# Patient Record
Sex: Female | Born: 1970 | Race: White | Hispanic: No | Marital: Single | Smoking: Never smoker
Health system: Southern US, Community
[De-identification: ages and names within clinical notes are randomized; demographics above are authoritative.]

## PROBLEM LIST (undated history)

## (undated) DIAGNOSIS — F319 Bipolar disorder, unspecified: Secondary | ICD-10-CM

## (undated) DIAGNOSIS — I639 Cerebral infarction, unspecified: Secondary | ICD-10-CM

## (undated) HISTORY — PX: TONSILLECTOMY: SUR1361

## (undated) HISTORY — PX: TUBAL LIGATION: SHX77

## (undated) HISTORY — PX: REFRACTIVE SURGERY: SHX103

## (undated) HISTORY — PX: BUNIONECTOMY: SHX129

---

## 1999-02-10 ENCOUNTER — Other Ambulatory Visit: Admission: RE | Admit: 1999-02-10 | Discharge: 1999-02-10 | Payer: Self-pay | Admitting: Family Medicine

## 2000-08-23 ENCOUNTER — Other Ambulatory Visit: Admission: RE | Admit: 2000-08-23 | Discharge: 2000-08-23 | Payer: Self-pay | Admitting: Specialist

## 2001-06-07 ENCOUNTER — Other Ambulatory Visit: Admission: RE | Admit: 2001-06-07 | Discharge: 2001-06-07 | Payer: Self-pay | Admitting: Specialist

## 2011-03-21 ENCOUNTER — Ambulatory Visit: Payer: Medicaid Other | Attending: Orthopedic Surgery | Admitting: Occupational Therapy

## 2011-03-21 DIAGNOSIS — R609 Edema, unspecified: Secondary | ICD-10-CM | POA: Insufficient documentation

## 2011-03-21 DIAGNOSIS — IMO0001 Reserved for inherently not codable concepts without codable children: Secondary | ICD-10-CM | POA: Insufficient documentation

## 2011-03-21 DIAGNOSIS — M6281 Muscle weakness (generalized): Secondary | ICD-10-CM | POA: Insufficient documentation

## 2011-03-21 DIAGNOSIS — M25549 Pain in joints of unspecified hand: Secondary | ICD-10-CM | POA: Insufficient documentation

## 2011-03-29 ENCOUNTER — Ambulatory Visit: Payer: Medicaid Other | Admitting: Occupational Therapy

## 2011-04-07 ENCOUNTER — Ambulatory Visit: Payer: Medicaid Other | Admitting: Occupational Therapy

## 2011-04-07 ENCOUNTER — Encounter: Payer: Medicaid Other | Admitting: Occupational Therapy

## 2011-04-13 ENCOUNTER — Encounter: Payer: Medicaid Other | Admitting: Occupational Therapy

## 2011-04-20 ENCOUNTER — Ambulatory Visit: Payer: Medicaid Other | Attending: Orthopedic Surgery | Admitting: Occupational Therapy

## 2011-04-20 DIAGNOSIS — R609 Edema, unspecified: Secondary | ICD-10-CM | POA: Insufficient documentation

## 2011-04-20 DIAGNOSIS — M25549 Pain in joints of unspecified hand: Secondary | ICD-10-CM | POA: Insufficient documentation

## 2011-04-20 DIAGNOSIS — M6281 Muscle weakness (generalized): Secondary | ICD-10-CM | POA: Insufficient documentation

## 2011-04-20 DIAGNOSIS — IMO0001 Reserved for inherently not codable concepts without codable children: Secondary | ICD-10-CM | POA: Insufficient documentation

## 2011-11-18 ENCOUNTER — Emergency Department (INDEPENDENT_AMBULATORY_CARE_PROVIDER_SITE_OTHER)

## 2011-11-18 ENCOUNTER — Encounter (HOSPITAL_BASED_OUTPATIENT_CLINIC_OR_DEPARTMENT_OTHER): Payer: Self-pay | Admitting: Family Medicine

## 2011-11-18 ENCOUNTER — Emergency Department (HOSPITAL_BASED_OUTPATIENT_CLINIC_OR_DEPARTMENT_OTHER)
Admission: EM | Admit: 2011-11-18 | Discharge: 2011-11-18 | Disposition: A | Discharge status: Home / Self Care | Attending: Emergency Medicine | Admitting: Emergency Medicine

## 2011-11-18 DIAGNOSIS — M545 Low back pain, unspecified: Secondary | ICD-10-CM | POA: Insufficient documentation

## 2011-11-18 DIAGNOSIS — S339XXA Sprain of unspecified parts of lumbar spine and pelvis, initial encounter: Secondary | ICD-10-CM | POA: Insufficient documentation

## 2011-11-18 DIAGNOSIS — X500XXA Overexertion from strenuous movement or load, initial encounter: Secondary | ICD-10-CM | POA: Insufficient documentation

## 2011-11-18 DIAGNOSIS — S39012A Strain of muscle, fascia and tendon of lower back, initial encounter: Secondary | ICD-10-CM

## 2011-11-18 MED ORDER — IBUPROFEN 800 MG PO TABS
800.0000 mg | ORAL_TABLET | Freq: Once | ORAL | Status: AC
  Administered 2011-11-18: 800 mg via ORAL
  Filled 2011-11-18: qty 1

## 2011-11-18 MED ORDER — TRAMADOL HCL 50 MG PO TABS
50.0000 mg | ORAL_TABLET | Freq: Once | ORAL | Status: AC
  Administered 2011-11-18: 50 mg via ORAL
  Filled 2011-11-18: qty 1

## 2011-11-18 MED ORDER — TRAMADOL HCL 50 MG PO TABS: 50.0000 mg | ORAL_TABLET | Freq: Four times a day (QID) | ORAL | Status: AC | PRN

## 2011-11-18 MED ORDER — NAPROXEN 500 MG PO TABS: 500.0000 mg | ORAL_TABLET | Freq: Two times a day (BID) | ORAL | Status: AC

## 2011-11-18 NOTE — Discharge Instructions (Signed)

## 2011-11-18 NOTE — ED Notes (Signed)
Pt c/o left low back pain after bending and lifting on Monday. Pt able to ambulate and has been working since injury. Pt taking aleve with some relief.

## 2011-11-18 NOTE — ED Provider Notes (Signed)
History     CSN: [id number]  Arrival date & time 11/18/11  1034   First MD Initiated Contact with Patient 11/18/11 1113      Chief Complaint  Patient presents with  . Back Pain    (Consider location/radiation/quality/duration/timing/severity/associated sxs/prior treatment) Patient is a 41 y.o. female presenting with back pain. The history is provided by the patient. No language interpreter was used.  Back Pain  This is a new problem. Episode onset: 4 days ago. The problem occurs constantly. The problem has been gradually worsening. The pain is associated with lifting heavy objects and twisting. The pain is present in the lumbar spine. The quality of the pain is described as aching and shooting. The pain does not radiate. The pain is moderate. The symptoms are aggravated by bending and certain positions. The pain is the same all the time. Pertinent negatives include no chest pain, no fever, no numbness, no weight loss, no headaches, no abdominal pain, no bowel incontinence, no perianal numbness, no bladder incontinence, no dysuria, no paresthesias, no paresis, no tingling and no weakness. She has tried NSAIDs for the symptoms. The treatment provided mild relief.    Past Medical History  Diagnosis Date  . Migraine     Past Surgical History  Procedure Date  . Tonsillectomy   . Tubal ligation   . Eye surgery     No family history on file.  History  Substance Use Topics  . Smoking status: Never Smoker   . Smokeless tobacco: Not on file  . Alcohol Use: Yes    OB History    Grav Para Term Preterm Abortions TAB SAB Ect Mult Living                  Review of Systems  Constitutional: Negative for fever, weight loss, activity change, appetite change and fatigue.  HENT: Negative for congestion, sore throat, rhinorrhea, neck pain and neck stiffness.   Respiratory: Negative for cough and shortness of breath.   Cardiovascular: Negative for chest pain and palpitations.    Gastrointestinal: Negative for nausea, vomiting, abdominal pain and bowel incontinence.  Genitourinary: Negative for bladder incontinence, dysuria, urgency, frequency and flank pain.  Musculoskeletal: Positive for back pain. Negative for myalgias and arthralgias.  Neurological: Negative for dizziness, tingling, weakness, light-headedness, numbness, headaches and paresthesias.  All other systems reviewed and are negative.    Allergies  Aspirin  Home Medications   Current Outpatient Rx  Name Route Sig Dispense Refill  . ESCITALOPRAM OXALATE 10 MG PO TABS Oral Take 10 mg by mouth daily.    Marland Kitchen LAMICTAL PO Oral Take by mouth.    Marland Kitchen NAPROXEN 500 MG PO TABS Oral Take 1 tablet (500 mg total) by mouth 2 (two) times daily. 30 tablet 0  . TRAMADOL HCL 50 MG PO TABS Oral Take 1 tablet (50 mg total) by mouth every 6 (six) hours as needed for pain. 15 tablet 0    BP 140/78  Pulse 73  Temp(Src) 98.4 F (36.9 C) (Oral)  Resp 20  SpO2 100%  LMP 11/06/2011  Physical Exam  Nursing note and vitals reviewed. Constitutional: She is oriented to person, place, and time. She appears well-developed and well-nourished.       Uncomfortable in appearance  HENT:  Head: Normocephalic and atraumatic.  Mouth/Throat: Oropharynx is clear and moist.  Eyes: Conjunctivae and EOM are normal. Pupils are equal, round, and reactive to light.  Neck: Normal range of motion. Neck supple.  Cardiovascular: Normal  rate, regular rhythm, normal heart sounds and intact distal pulses.  Exam reveals no gallop and no friction rub.   No murmur heard. Pulmonary/Chest: Effort normal and breath sounds normal. No respiratory distress. She exhibits no tenderness.  Abdominal: Soft. Bowel sounds are normal. There is no tenderness. There is no rebound and no guarding.  Musculoskeletal:       Lumbar back: She exhibits decreased range of motion, tenderness, pain and spasm. She exhibits no bony tenderness.       Back:  Neurological:  She is alert and oriented to person, place, and time. She has normal strength and normal reflexes. No cranial nerve deficit or sensory deficit.  Skin: Skin is warm and dry. No rash noted.    ED Course  Procedures (including critical care time)  Labs Reviewed - No data to display Dg Lumbar Spine Complete  11/18/2011  *RADIOLOGY REPORT*  Clinical Data: 41 year old female with low back pain after bending and lifting.  LUMBAR SPINE - COMPLETE 4+ VIEW  Comparison: None.  Findings: Bone mineralization is within normal limits.  Normal lumbar segmentation.  Normal vertebral height and alignment. Relatively preserved disc spaces.  No pars fracture.  Negative SI joints and sacral ala.  IMPRESSION: Negative radiographic appearance of the lumbar spine.  Original Report Authenticated By: Ulla Potash III, M.D.     1. Lumbosacral strain       MDM  Lumbosacral strain with negative films. No concern about a leading cause of back pain such as cauda equina. She has no neurologic symptoms. No urinary symptoms. She is encouraged to apply ice for 2 days and heat thereafter. Provided strict return precautions. Provided a prescription for Ultram and Naprosyn. She refused additional pain medication and muscle relaxants.        Dayton Bailiff, MD 11/18/11 1255

## 2011-11-18 NOTE — ED Notes (Signed)
Patient transported to X-ray 

## 2012-10-22 ENCOUNTER — Other Ambulatory Visit: Payer: Self-pay | Admitting: Family Medicine

## 2012-10-22 ENCOUNTER — Other Ambulatory Visit (HOSPITAL_COMMUNITY)
Admission: RE | Admit: 2012-10-22 | Discharge: 2012-10-22 | Disposition: A | Discharge status: Home / Self Care | Payer: BC Managed Care – PPO | Source: Ambulatory Visit | Attending: Family Medicine | Admitting: Family Medicine

## 2012-10-22 DIAGNOSIS — Z124 Encounter for screening for malignant neoplasm of cervix: Secondary | ICD-10-CM | POA: Insufficient documentation

## 2014-01-16 ENCOUNTER — Encounter (HOSPITAL_COMMUNITY): Payer: Self-pay | Admitting: Emergency Medicine

## 2014-01-16 ENCOUNTER — Emergency Department (HOSPITAL_COMMUNITY): Payer: Federal, State, Local not specified - PPO

## 2014-01-16 ENCOUNTER — Observation Stay (HOSPITAL_COMMUNITY)
Admission: EM | Admit: 2014-01-16 | Discharge: 2014-01-17 | Disposition: A | Discharge status: Home / Self Care | Payer: Federal, State, Local not specified - PPO | Attending: Cardiology | Admitting: Cardiology

## 2014-01-16 DIAGNOSIS — I10 Essential (primary) hypertension: Secondary | ICD-10-CM | POA: Diagnosis present

## 2014-01-16 DIAGNOSIS — E785 Hyperlipidemia, unspecified: Secondary | ICD-10-CM

## 2014-01-16 DIAGNOSIS — Z886 Allergy status to analgesic agent status: Secondary | ICD-10-CM | POA: Insufficient documentation

## 2014-01-16 DIAGNOSIS — Z79899 Other long term (current) drug therapy: Secondary | ICD-10-CM | POA: Insufficient documentation

## 2014-01-16 DIAGNOSIS — G43909 Migraine, unspecified, not intractable, without status migrainosus: Secondary | ICD-10-CM | POA: Insufficient documentation

## 2014-01-16 DIAGNOSIS — R079 Chest pain, unspecified: Principal | ICD-10-CM | POA: Diagnosis present

## 2014-01-16 HISTORY — DX: Cerebral infarction, unspecified: I63.9

## 2014-01-16 HISTORY — DX: Bipolar disorder, unspecified: F31.9

## 2014-01-16 LAB — CBC
HEMATOCRIT: 39.3 % (ref 36.0–46.0)
HEMOGLOBIN: 14.4 g/dL (ref 12.0–15.0)
MCH: 31.8 pg (ref 26.0–34.0)
MCHC: 36.6 g/dL — AB (ref 30.0–36.0)
MCV: 86.8 fL (ref 78.0–100.0)
Platelets: 253 10*3/uL (ref 150–400)
RBC: 4.53 MIL/uL (ref 3.87–5.11)
RDW: 12.7 % (ref 11.5–15.5)
WBC: 8.6 10*3/uL (ref 4.0–10.5)

## 2014-01-16 LAB — BASIC METABOLIC PANEL
BUN: 13 mg/dL (ref 6–23)
CALCIUM: 10 mg/dL (ref 8.4–10.5)
CO2: 23 mEq/L (ref 19–32)
CREATININE: 0.94 mg/dL (ref 0.50–1.10)
Chloride: 103 mEq/L (ref 96–112)
GFR, EST AFRICAN AMERICAN: 84 mL/min — AB (ref >90)
GFR, EST NON AFRICAN AMERICAN: 72 mL/min — AB (ref >90)
GLUCOSE: 103 mg/dL — AB (ref 70–99)
Potassium: 3.8 mEq/L (ref 3.7–5.3)
Sodium: 139 mEq/L (ref 137–147)

## 2014-01-16 LAB — HEMOGLOBIN A1C
Hgb A1c MFr Bld: 5.3 % (ref <5.7)
Mean Plasma Glucose: 105 mg/dL (ref <117)

## 2014-01-16 LAB — I-STAT TROPONIN, ED: Troponin i, poc: 0 ng/mL (ref 0.00–0.08)

## 2014-01-16 LAB — CBG MONITORING, ED: Glucose-Capillary: 105 mg/dL — ABNORMAL HIGH (ref 70–99)

## 2014-01-16 LAB — TSH: TSH: 3.59 u[IU]/mL (ref 0.350–4.500)

## 2014-01-16 LAB — TROPONIN I

## 2014-01-16 MED ORDER — NITROGLYCERIN 0.4 MG SL SUBL: 0.4000 mg | SUBLINGUAL_TABLET | SUBLINGUAL | Status: DC | PRN

## 2014-01-16 MED ORDER — NITROGLYCERIN IN D5W 200-5 MCG/ML-% IV SOLN: 5.0000 ug/min | INTRAVENOUS | Status: DC

## 2014-01-16 MED ORDER — MORPHINE SULFATE 4 MG/ML IJ SOLN
4.0000 mg | Freq: Once | INTRAMUSCULAR | Status: AC
  Administered 2014-01-16: 4 mg via INTRAVENOUS
  Filled 2014-01-16: qty 1

## 2014-01-16 MED ORDER — METOPROLOL TARTRATE 12.5 MG HALF TABLET
12.5000 mg | ORAL_TABLET | Freq: Two times a day (BID) | ORAL | Status: DC
  Administered 2014-01-16 – 2014-01-17 (×2): 12.5 mg via ORAL
  Filled 2014-01-16 (×3): qty 1

## 2014-01-16 MED ORDER — ATORVASTATIN CALCIUM 40 MG PO TABS
40.0000 mg | ORAL_TABLET | Freq: Every day | ORAL | Status: DC
  Filled 2014-01-16 (×2): qty 1

## 2014-01-16 MED ORDER — NITROGLYCERIN IN D5W 200-5 MCG/ML-% IV SOLN
5.0000 ug/min | INTRAVENOUS | Status: DC
  Administered 2014-01-16: 5 ug/min via INTRAVENOUS
  Filled 2014-01-16: qty 250

## 2014-01-16 MED ORDER — LAMOTRIGINE 200 MG PO TABS
200.0000 mg | ORAL_TABLET | Freq: Every day | ORAL | Status: DC
  Administered 2014-01-16 – 2014-01-17 (×2): 200 mg via ORAL
  Filled 2014-01-16 (×3): qty 1

## 2014-01-16 MED ORDER — ASPIRIN EC 81 MG PO TBEC
81.0000 mg | DELAYED_RELEASE_TABLET | Freq: Every day | ORAL | Status: DC
  Administered 2014-01-17: 81 mg via ORAL
  Filled 2014-01-16: qty 1

## 2014-01-16 MED ORDER — ACETAMINOPHEN 325 MG PO TABS
650.0000 mg | ORAL_TABLET | Freq: Four times a day (QID) | ORAL | Status: DC | PRN
  Administered 2014-01-16 – 2014-01-17 (×2): 650 mg via ORAL
  Filled 2014-01-16 (×2): qty 2

## 2014-01-16 MED ORDER — PERPHENAZINE 2 MG PO TABS
2.0000 mg | ORAL_TABLET | Freq: Every day | ORAL | Status: DC
  Administered 2014-01-16: 2 mg via ORAL
  Filled 2014-01-16 (×2): qty 1

## 2014-01-16 MED ORDER — ESCITALOPRAM OXALATE 10 MG PO TABS
10.0000 mg | ORAL_TABLET | Freq: Every day | ORAL | Status: DC
  Administered 2014-01-16 – 2014-01-17 (×2): 10 mg via ORAL
  Filled 2014-01-16 (×2): qty 1

## 2014-01-16 MED ORDER — ESCITALOPRAM OXALATE 10 MG PO TABS: 10.0000 mg | ORAL_TABLET | Freq: Every day | ORAL | Status: DC

## 2014-01-16 NOTE — ED Notes (Signed)
Dr. Brackbill at bedside 

## 2014-01-16 NOTE — H&P (Signed)
Candice Reynolds is an 43 y.o. female.   Chief Complaint: Chest Pain HPI:  The patient is a 43 yo female with a history of migraines.  She works at the [employer] as a Development worker, community carrier.  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  At its worst the intensity was 8/10.  There was radiation to her left shoulder blade.  She reports feeling SOB, more tired, and warm.  She tried taking Tums with no relief.  She went to [facility] and they gave her four baby ASA and on route to [facility], EMS gave a SL NTG which apparently eased the pain some.  It seems as though the pain is worse when she exhales.   She also gets some mild lower extremity edema.  The patient currently denies nausea, vomiting, fever, orthopnea, dizziness, PND, cough, congestion, abdominal pain, hematochezia, melena.  Her father has had seven stents and the first was at ~ age 87.  She has never smoked and drinks one beer after work.    Past Medical History  Diagnosis Date  . Migraine     Past Surgical History  Procedure Laterality Date  . Tonsillectomy    . Tubal ligation    . Eye surgery      No family history on file. Social History:  reports that she has never smoked. She does not have any smokeless tobacco history on file. She reports that she drinks alcohol. She reports that she does not use illicit drugs.  Allergies:  Allergies  Allergen Reactions  . Aspirin      (Not in a hospital admission)  Results for orders placed during the hospital encounter of 01/16/14 (from the past 48 hour(s))  CBG MONITORING, ED     Status: Abnormal   Collection Time    01/16/14 12:25 PM      Result Value Ref Range   Glucose-Capillary 105 (*) 70 - 99 mg/dL  CBC     Status: Abnormal   Collection Time    01/16/14 12:26 PM      Result Value Ref Range   WBC 8.6  4.0 - 10.5 K/uL   RBC 4.53  3.87 - 5.11 MIL/uL   Hemoglobin 14.4  12.0 - 15.0 g/dL   HCT  39.3  36.0 - 46.0 %   MCV 86.8  78.0 - 100.0 fL   MCH 31.8  26.0 - 34.0 pg   MCHC 36.6 (*) 30.0 - 36.0 g/dL   RDW 12.7  11.5 - 15.5 %   Platelets 253  150 - 400 K/uL  BASIC METABOLIC PANEL     Status: Abnormal   Collection Time    01/16/14 12:26 PM      Result Value Ref Range   Sodium 139  137 - 147 mEq/L   Potassium 3.8  3.7 - 5.3 mEq/L   Chloride 103  96 - 112 mEq/L   CO2 23  19 - 32 mEq/L   Glucose, Bld 103 (*) 70 - 99 mg/dL   BUN 13  6 - 23 mg/dL   Creatinine, Ser 0.94  0.50 - 1.10 mg/dL   Calcium 10.0  8.4 - 10.5 mg/dL   GFR calc non Af Amer 72 (*) >90 mL/min   GFR calc Af Amer 84 (*) >90 mL/min   Comment: (NOTE)     The eGFR has been calculated using the CKD EPI equation.  This calculation has not been validated in all clinical situations.     eGFR's persistently <90 mL/min signify possible Chronic Kidney     Disease.  Randolm Idol, ED     Status: None   Collection Time    01/16/14 12:46 PM      Result Value Ref Range   Troponin i, poc 0.00  0.00 - 0.08 ng/mL   Comment 3            Comment: Due to the release kinetics of cTnI,     a negative result within the first hours     of the onset of symptoms does not rule out     myocardial infarction with certainty.     If myocardial infarction is still suspected,     repeat the test at appropriate intervals.   Dg Chest 2 View  01/16/2014   CLINICAL DATA:  Chest pain.  Hypertension.  EXAM: CHEST  2 VIEW  COMPARISON:  None.  FINDINGS: The heart size and mediastinal contours are within normal limits. Both lungs are clear. The visualized skeletal structures are unremarkable.  IMPRESSION: No active cardiopulmonary disease.   Electronically Signed   By: Dereck Ligas M.D.   On: 01/16/2014 13:15    Review of Systems  All other systems reviewed and are negative.   Blood pressure 160/88, pulse 95, temperature 98.9 F (37.2 C), temperature source Oral, resp. rate 19, SpO2 98.00%. Physical Exam  Nursing note and vitals  reviewed. Constitutional: She is oriented to person, place, and time. She appears well-developed and well-nourished.  Appears uncomfortable  HENT:  Head: Normocephalic and atraumatic.  Mouth/Throat: Oropharynx is clear and moist. No oropharyngeal exudate.  Eyes: EOM are normal. Pupils are equal, round, and reactive to light. No scleral icterus.  Neck: Normal range of motion. Neck supple. No JVD present.  Cardiovascular: Normal rate, regular rhythm, S1 normal and S2 normal.   No murmur heard. Pulses:      Radial pulses are 2+ on the right side, and 2+ on the left side.       Dorsalis pedis pulses are 2+ on the right side, and 2+ on the left side.  No Carotid Bruit  Respiratory: Effort normal and breath sounds normal. No respiratory distress. She has no wheezes. She has no rales.  GI: Soft. Bowel sounds are normal. She exhibits no distension. There is no tenderness.  Musculoskeletal: She exhibits no edema.  Lymphadenopathy:    She has no cervical adenopathy.  Neurological: She is alert and oriented to person, place, and time. She exhibits normal muscle tone.  Skin: Skin is warm and dry.  Psychiatric: She has a normal mood and affect.     Assessment/Plan Active Problems:   Chest pain at rest She will be admitted for observation.  Cp has typical and atypical features(more pronounced with exhaling).  The EKG from [facility] had TWI in V1-2 and here just V1.  Cycle troponin, check lipids, TSH, A1C.  Add lopressor 12.66m BID, lipitor and IV NTG.  If she rules out, we will do a treadmill myovue tomorrow.    Hypertension  Monitor on NTG and lopressor.    BTarri Fuller6/11/2013, 3:13 PM  Seen with Mr. [place]ara [facility] [place]  Her history is concerning for ischemic heart pain. She began to have mild twinges of chest pain several weeks ago. Her PCP advised trial of TUMS which she did but with no effect. Today's episode was associated with brief radiation to the back at  times and she also noted some cold  tingling of the fingers of her left hand. Her EKG shows no acute changes. Initial troponin is normal. Her chest pain has now been resolved after IV morphine. Plan as above. Treadmill myoview in am if she rules out. Will recheck EKG in am.

## 2014-01-16 NOTE — ED Provider Notes (Signed)
Medical screening examination/treatment/procedure(s) were conducted as a shared visit with non-physician practitioner(s) and myself.  I personally evaluated the patient during the encounter.   EKG Interpretation   Date/Time:  Thursday January 16 2014 12:19:25 EDT Ventricular Rate:  72 PR Interval:  157 QRS Duration: 75 QT Interval:  382 QTC Calculation: 418 R Axis:   38 Text Interpretation:  Sinus rhythm Probable left atrial enlargement No old  tracing to compare Confirmed by [facility]  MD, TREY ([id number]) on 01/16/2014  2:57:08 PM        Candyce Churn III, MD 01/16/14 2037

## 2014-01-16 NOTE — ED Notes (Signed)
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  Seen at [facility] physicians, given 324 ASA and 1 SL NTG pta, states pain is not as strong.

## 2014-01-16 NOTE — ED Notes (Signed)
Cards at bedside

## 2014-01-16 NOTE — ED Notes (Signed)
Rob Browning, PA at bedside  

## 2014-01-16 NOTE — ED Notes (Signed)
Phlebotomy at bedside.

## 2014-01-16 NOTE — ED Notes (Signed)
Patient transferred to [address] [place] by this nurse

## 2014-01-16 NOTE — ED Provider Notes (Signed)
Medical screening examination/treatment/procedure(s) were conducted as a shared visit with non-physician practitioner(s) and myself.  I personally evaluated the patient during the encounter.   EKG Interpretation   Date/Time:  Thursday January 16 2014 12:19:25 EDT Ventricular Rate:  72 PR Interval:  157 QRS Duration: 75 QT Interval:  382 QTC Calculation: 418 R Axis:   38 Text Interpretation:  Sinus rhythm Probable left atrial enlargement No old  tracing to compare Confirmed by [facility]  MD, TREY ([id number]) on 01/16/2014  2:57:40 PM      43 year old female presenting with chest pain. On exam, nontoxic, not distressed, no chest wall tenderness to palpation, heart sounds normal with regular rate and rhythm, lungs clear to auscultation bilaterally.  She has multiple cardiac risk factors. Plan to discuss with cardiology.  Admit.  Clinical Impression: 1. Chest pain       Candyce Churn III, MD 01/16/14 2014

## 2014-01-16 NOTE — ED Provider Notes (Signed)
CSN: [id number]     Arrival date & time 01/16/14  1210 History   First MD Initiated Contact with Patient 01/16/14 1212     Chief Complaint  Patient presents with  . Chest Pain     (Consider location/radiation/quality/duration/timing/severity/associated sxs/prior Treatment) HPI Comments: Patient presents to the emergency department with chief complaint of chest pain. She states the chest pain started this morning around 10:00. She describes pain as sharp and left-sided. She rates it as a 6/10. 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 She denies any associated shortness of breath or diaphoresis. She states that she was seen [facility] physicians, and was given aspirin, and nitroglycerin. She reports mild improvement with nitroglycerin. She has extensive family history of heart disease, and was referred to the emergency department by [facility].  The history is provided by the patient. No language interpreter was used.    Past Medical History  Diagnosis Date  . Migraine    Past Surgical History  Procedure Laterality Date  . Tonsillectomy    . Tubal ligation    . Eye surgery     No family history on file. History  Substance Use Topics  . Smoking status: Never Smoker   . Smokeless tobacco: Not on file  . Alcohol Use: Yes   OB History   Grav Para Term Preterm Abortions TAB SAB Ect Mult Living                 Review of Systems  Constitutional: Negative for fever and chills.  Respiratory: Negative for shortness of breath.   Cardiovascular: Positive for chest pain.  Gastrointestinal: Negative for nausea, vomiting, diarrhea and constipation.  Genitourinary: Negative for dysuria.      Allergies  Aspirin  Home Medications   Prior to Admission medications   Medication Sig Start Date End Date Taking? Authorizing Provider  escitalopram (LEXAPRO) 10 MG tablet Take 10 mg by mouth daily.    Historical Provider, MD  LamoTRIgine (LAMICTAL PO) Take by mouth.    Historical  Provider, MD   BP 151/87  Pulse 86  Temp(Src) 98.9 F (37.2 C) (Oral)  Resp 13  SpO2 98% Physical Exam  Nursing note and vitals reviewed. Constitutional: She is oriented to person, place, and time. She appears well-developed and well-nourished.  HENT:  Head: Normocephalic and atraumatic.  Eyes: Conjunctivae and EOM are normal. Pupils are equal, round, and reactive to light.  Neck: Normal range of motion. Neck supple.  Cardiovascular: Normal rate and regular rhythm.  Exam reveals no gallop and no friction rub.   No murmur heard. Pulmonary/Chest: Effort normal and breath sounds normal. No respiratory distress. She has no wheezes. She has no rales. She exhibits no tenderness.  Clear to auscultation  Abdominal: Soft. She exhibits no distension and no mass. There is no tenderness. There is no rebound and no guarding.  Musculoskeletal: Normal range of motion. She exhibits no edema and no tenderness.  Neurological: She is alert and oriented to person, place, and time.  Skin: Skin is warm and dry.  Psychiatric: She has a normal mood and affect. Her behavior is normal. Judgment and thought content normal.    ED Course  Procedures (including critical care time) Labs Review Labs Reviewed  CBC - Abnormal; Notable for the following:    MCHC 36.6 (*)    All other components within normal limits  BASIC METABOLIC PANEL - Abnormal; Notable for the following:    Glucose, Bld 103 (*)  GFR calc non Af Amer 72 (*)    GFR calc Af Amer 84 (*)    All other components within normal limits  CBG MONITORING, ED - Abnormal; Notable for the following:    Glucose-Capillary 105 (*)    All other components within normal limits  I-STAT TROPOININ, ED    Imaging Review Dg Chest 2 View  01/16/2014   CLINICAL DATA:  Chest pain.  Hypertension.  EXAM: CHEST  2 VIEW  COMPARISON:  None.  FINDINGS: The heart size and mediastinal contours are within normal limits. Both lungs are clear. The visualized skeletal  structures are unremarkable.  IMPRESSION: No active cardiopulmonary disease.   Electronically Signed   By: Andreas Newport M.D.   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 She has extensive family history of heart disease. She was seen by her PCP, who recommended that she come to the emergency department. PCP notes new T-wave inversion in V2.  HEART score of 4.  Patient discussed with Dr. Loretha Stapler, who agrees with cardiology consultation.  3:39 PM Patient seen by cardiology.  Admit for overnight observation.  Roxy Horseman, PA-C 01/16/14 1539

## 2014-01-17 ENCOUNTER — Observation Stay (HOSPITAL_COMMUNITY): Payer: Federal, State, Local not specified - PPO

## 2014-01-17 DIAGNOSIS — R079 Chest pain, unspecified: Secondary | ICD-10-CM

## 2014-01-17 DIAGNOSIS — E785 Hyperlipidemia, unspecified: Secondary | ICD-10-CM

## 2014-01-17 DIAGNOSIS — I10 Essential (primary) hypertension: Secondary | ICD-10-CM

## 2014-01-17 LAB — TROPONIN I

## 2014-01-17 LAB — LIPID PANEL
CHOL/HDL RATIO: 3.8 ratio
Cholesterol: 188 mg/dL (ref 0–200)
HDL: 50 mg/dL (ref >39)
LDL CALC: 112 mg/dL — AB (ref 0–99)
Triglycerides: 132 mg/dL (ref <150)
VLDL: 26 mg/dL (ref 0–40)

## 2014-01-17 MED ORDER — HYDROCODONE-ACETAMINOPHEN 5-325 MG PO TABS
1.0000 | ORAL_TABLET | ORAL | Status: DC | PRN
  Administered 2014-01-17: 1 via ORAL
  Filled 2014-01-17: qty 1

## 2014-01-17 MED ORDER — TECHNETIUM TC 99M SESTAMIBI GENERIC - CARDIOLITE
10.0000 | Freq: Once | INTRAVENOUS | Status: AC | PRN
  Administered 2014-01-17: 10 via INTRAVENOUS

## 2014-01-17 MED ORDER — REGADENOSON 0.4 MG/5ML IV SOLN
INTRAVENOUS | Status: AC
  Administered 2014-01-17: 0.4 mg via INTRAVENOUS
  Filled 2014-01-17: qty 5

## 2014-01-17 MED ORDER — REGADENOSON 0.4 MG/5ML IV SOLN
0.4000 mg | Freq: Once | INTRAVENOUS | Status: AC
  Administered 2014-01-17: 0.4 mg via INTRAVENOUS

## 2014-01-17 MED ORDER — TECHNETIUM TC 99M SESTAMIBI GENERIC - CARDIOLITE
30.0000 | Freq: Once | INTRAVENOUS | Status: AC | PRN
  Administered 2014-01-17: 30 via INTRAVENOUS

## 2014-01-17 MED ORDER — ATORVASTATIN CALCIUM 40 MG PO TABS: 40.0000 mg | ORAL_TABLET | Freq: Every day | ORAL | Status: DC

## 2014-01-17 MED ORDER — ONDANSETRON HCL 4 MG/2ML IJ SOLN
4.0000 mg | Freq: Three times a day (TID) | INTRAMUSCULAR | Status: DC | PRN
  Administered 2014-01-17: 4 mg via INTRAVENOUS
  Filled 2014-01-17: qty 2

## 2014-01-17 NOTE — Progress Notes (Signed)
    Subjective:  No further CP. Had some nausea and HA prior. Turing off NTG  Objective:  Vital Signs in the last 24 hours: Temp:  [98 F (36.7 C)-98.9 F (37.2 C)] 98 F (36.7 C) (06/05 0805) Pulse Rate:  [74-95] 76 (06/05 0805) Resp:  [10-24] 18 (06/05 0805) BP: (103-165)/(61-96) 103/61 mmHg (06/05 0805) SpO2:  [95 %-100 %] 95 % (06/05 0805) Weight:  [180 lb 1.6 oz (81.693 kg)] 180 lb 1.6 oz (81.693 kg) (06/05 0353)  Intake/Output from previous day: 06/04 0701 - 06/05 0700 In: 355.9 [P.O.:240; I.V.:115.9] Out: 350 [Urine:350]   Physical Exam: General: Well developed, well nourished, in no acute distress. Head:  Normocephalic and atraumatic. Lungs: Clear to auscultation and percussion. Heart: Normal S1 and S2.  No murmur, rubs or gallops.  Abdomen: soft, non-tender, positive bowel sounds. Extremities: No clubbing or cyanosis. No edema. Neurologic: Alert and oriented x 3.    Lab Results:  Recent Labs  01/16/14 1226  WBC 8.6  HGB 14.4  PLT 253    Recent Labs  01/16/14 1226  NA 139  K 3.8  CL 103  CO2 23  GLUCOSE 103*  BUN 13  CREATININE 0.94    Recent Labs  01/16/14 2227 01/17/14 0508  TROPONINI <0.30 <0.30     Imaging: Dg Chest 2 View  01/16/2014   CLINICAL DATA:  Chest pain.  Hypertension.  EXAM: CHEST  2 VIEW  COMPARISON:  None.  FINDINGS: The heart size and mediastinal contours are within normal limits. Both lungs are clear. The visualized skeletal structures are unremarkable.  IMPRESSION: No active cardiopulmonary disease.   Electronically Signed   By: Andreas Newport M.D.   On: 01/16/2014 13:15   Personally viewed.   Telemetry: No adverse rhythms Personally viewed.   EKG:  NSR, no ST changes  Cardiac Studies:  NUC pending  Assessment/Plan:  Principal Problem:   Chest pain at rest Active Problems:   Hypertension   -NUC stress -stop NTG -Trop normal -ECG reassuring -DC home if low risk NUC.   Donato Schultz 01/17/2014, 9:04  AM

## 2014-01-17 NOTE — Discharge Summary (Signed)
Personally seen and examined. Agree with above. Reassuring NUC stress, low risk.  Mark Skains, MD  

## 2014-01-17 NOTE — Discharge Summary (Signed)
Physician Discharge Summary  Patient ID: Candice Reynolds MRN: [id number] DOB/AGE: 16-Dec-1970 43 y.o.  Admit date: 01/16/2014 Discharge date: 01/17/2014  Primary Cardiologist: Brackbill   Admission Diagnoses: Chest Pain at Rest  Discharge Diagnoses:  Principal Problem:   Chest pain at rest Active Problems:   Hypertension   Discharged Condition: stable  Hospital Course: The patient is a 43 y/o female, with a h/o migraines who presented to [facility] on 01/16/14 with a complaint of resting chest pain.  Her symptoms occurred while working. 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 There was radiation to her left shoulder blade. She also felt SOB and fatigue. She had no relief with antiacids. She went to [facility] and they gave her four baby ASA and on route to [facility], EMS gave a SL NTG which apparently eased the pain some.  In the ED, her EKG was non ischemic. Troponin was negative. CXR was unremarkable. However, given her exertional chest pain, it was decided to admit for observation and rule-out. Cardiac enzymes were cycled and were negative x 3. She underwent a Lexiscan NST that was low risk with no evidence of ischemia. LVEF was estimated at 72%. There were no wall motion abnormalities. Her chest pain was determined to be noncardiac. It should also be noted that a lipid panel was checked and her LDL was elevated at 112 mg/dL. She was placed on a statin and instructed to f/u with her PCP, Dr. Clovis Riley. She was seen and examined by Dr. Anne Fu, who determined she was stable for discharge home.    Consults: None  Significant Diagnostic Studies:  IMPRESSION: Exercise Capacity: N/A  BP Response: Normal  Clinical Symptoms: None  ECG Impression: No lexiscan EKG changes  Comparison with Prior Nuclear Study: None  Final Impression:  Normal lexiscan nuclear stress test. LVEF 72%, no wall motion abnormalities.  Treatments: See Hospital  Course  Discharge Exam: Blood pressure 116/64, pulse 89, temperature 98 F (36.7 C), temperature source Oral, resp. rate 16, weight 180 lb 1.6 oz (81.693 kg), SpO2 97.00%.   Disposition: 01-Home or Self Care  Discharge Instructions   Diet - low sodium heart healthy    Complete by:  As directed      Increase activity slowly    Complete by:  As directed             Medication List         ALPRAZolam 0.5 MG tablet  Commonly known as:  XANAX  Take 0.5 mg by mouth 4 (four) times daily as needed for anxiety.     atorvastatin 40 MG tablet  Commonly known as:  LIPITOR  Take 1 tablet (40 mg total) by mouth daily at 6 PM.     citalopram 20 MG tablet  Commonly known as:  CELEXA  Take 20 mg by mouth daily.     escitalopram 10 MG tablet  Commonly known as:  LEXAPRO  Take 10 mg by mouth daily.     lamoTRIgine 200 MG tablet  Commonly known as:  LAMICTAL  Take 200 mg by mouth at bedtime.     perphenazine 2 MG tablet  Commonly known as:  TRILAFON  Take 2 mg by mouth daily.     propranolol 10 MG tablet  Commonly known as:  INDERAL  Take 10 mg by mouth QID.           Follow-up Information   Follow up with  Lupe CarneyMITCHELL,DEAN, MD In 2 weeks.   Specialty:  Family Medicine   Contact information:   [address] [place] [address] [phone]      TIME SPENT ON DISCHARGE INCLUDING PHYSICIAN TIME: >30 MINUTES  Signed: Robbie LisBrittainy Jamelle Goldston 01/17/2014, 4:19 PM

## 2014-01-17 NOTE — Progress Notes (Signed)
lexiscan given. Pa present

## 2014-01-17 NOTE — Progress Notes (Signed)
Pt woke with headache 8/10 and nausea.  Pt denied chest pain.  MD on call, Dr. Adolm Joseph, notified and Zofran was ordered.  MD also recommended that since the pt was chest pain free, that the Nitro drip be titrated down.  Tylenol was given to the pt for the headache.  Pt continues to deny chest pain with Nitro now at 69mcg/min. Will continue to monitor.

## 2014-01-17 NOTE — Progress Notes (Signed)
UR completed 

## 2014-03-31 ENCOUNTER — Other Ambulatory Visit: Payer: Self-pay | Admitting: Family Medicine

## 2014-03-31 DIAGNOSIS — R1013 Epigastric pain: Secondary | ICD-10-CM

## 2014-04-04 ENCOUNTER — Ambulatory Visit
Admission: RE | Admit: 2014-04-04 | Discharge: 2014-04-04 | Disposition: A | Discharge status: Home / Self Care | Payer: Federal, State, Local not specified - PPO | Source: Ambulatory Visit | Attending: Family Medicine | Admitting: Family Medicine

## 2014-04-04 ENCOUNTER — Other Ambulatory Visit: Payer: Self-pay | Admitting: Family Medicine

## 2014-04-04 ENCOUNTER — Encounter (INDEPENDENT_AMBULATORY_CARE_PROVIDER_SITE_OTHER): Payer: Self-pay

## 2014-04-04 DIAGNOSIS — R1013 Epigastric pain: Secondary | ICD-10-CM

## 2015-01-31 ENCOUNTER — Encounter (HOSPITAL_COMMUNITY): Payer: Self-pay

## 2015-01-31 ENCOUNTER — Emergency Department (HOSPITAL_COMMUNITY)
Admission: EM | Admit: 2015-01-31 | Discharge: 2015-01-31 | Disposition: A | Discharge status: Home / Self Care | Payer: Federal, State, Local not specified - PPO | Source: Home / Self Care | Attending: Family Medicine | Admitting: Family Medicine

## 2015-01-31 DIAGNOSIS — B029 Zoster without complications: Secondary | ICD-10-CM | POA: Diagnosis not present

## 2015-01-31 MED ORDER — TRAMADOL HCL 50 MG PO TABS: 50.0000 mg | ORAL_TABLET | Freq: Four times a day (QID) | ORAL | Status: DC | PRN

## 2015-01-31 MED ORDER — VALACYCLOVIR HCL 1 G PO TABS: 1000.0000 mg | ORAL_TABLET | Freq: Three times a day (TID) | ORAL | Status: AC

## 2015-01-31 NOTE — ED Notes (Signed)
C/o painful rash on back, left flank area. Prior history of shingles

## 2015-01-31 NOTE — ED Provider Notes (Signed)
CSN: [id number]     Arrival date & time 01/31/15  1523 History   First MD Initiated Contact with Patient 01/31/15 1640     Chief Complaint  Patient presents with  . Rash   (Consider location/radiation/quality/duration/timing/severity/associated sxs/prior Treatment) HPI  44 yo F with prior history of shingles and TIA who presents with 2 day history of rash and burning Left side.  Diagnosed with shingles 6 years ago, rash in what sounds to be dermatomal fashion on Right thigh.  States she works for [employer] and has been under more stress than usual recently.  Surrounding skin with numbness and paresthesias.  No rash elsewhere.    Past Medical History  Diagnosis Date  . Migraine     "not as often anymore; take RX qd" (01/16/2014)  . Stroke ~ 2006    "light"  . Bipolar disorder     "very low stage" (01/16/2014)   Past Surgical History  Procedure Laterality Date  . Tonsillectomy    . Tubal ligation    . Bunionectomy Bilateral   . Refractive surgery Bilateral ~ 2001   Family History  Problem Relation Age of Onset  . CAD Father   . CAD Paternal Grandmother    History  Substance Use Topics  . Smoking status: Never Smoker   . Smokeless tobacco: Never Used  . Alcohol Use: 4.2 oz/week    7 Cans of beer per week     Comment: "@ least 1 beer/day"   OB History    No data available     Review of Systems  Allergies  Aspirin  Home Medications   Prior to Admission medications   Medication Sig Start Date End Date Taking? Authorizing Provider  ALPRAZolam Prudy Feeler) 0.5 MG tablet Take 0.5 mg by mouth 4 (four) times daily as needed for anxiety.  11/26/13   Historical Provider, MD  atorvastatin (LIPITOR) 40 MG tablet Take 1 tablet (40 mg total) by mouth daily at 6 PM. 01/17/14   Brittainy M Sharol Harness, PA-C  citalopram (CELEXA) 20 MG tablet Take 20 mg by mouth daily. 12/26/13   Historical Provider, MD  escitalopram (LEXAPRO) 10 MG tablet Take 10 mg by mouth daily.    Historical Provider, MD   lamoTRIgine (LAMICTAL) 200 MG tablet Take 200 mg by mouth at bedtime.    Historical Provider, MD  perphenazine (TRILAFON) 2 MG tablet Take 2 mg by mouth daily. 12/26/13   Historical Provider, MD  propranolol (INDERAL) 10 MG tablet Take 10 mg by mouth QID.  01/03/14   Historical Provider, MD  traMADol (ULTRAM) 50 MG tablet Take 1 tablet (50 mg total) by mouth every 6 (six) hours as needed. 01/31/15   Tobey Grim, MD  valACYclovir (VALTREX) 1000 MG tablet Take 1 tablet (1,000 mg total) by mouth 3 (three) times daily. X 7 days 01/31/15 02/14/15  Tobey Grim, MD   BP 141/78 mmHg  Pulse 88  Temp(Src) 98.2 F (36.8 C) (Oral)  Resp 12  SpO2 100% Physical Exam  Gen:  Alert, cooperative patient who appears stated age in no acute distress.  Vital signs reviewed. Skin:  Erythematous macular rash about 5 cm in diameter noted left flank T9/T10 region.  Another located same dermatome but about 3 cm medial.  With overlying vesicular lesions as well in same erythema.  No other lesions noted elsewhere.    ED Course  Procedures (including critical care time) Labs Review Labs Reviewed - No data to display  Imaging Review No results  found.   MDM   1. Shingles    - Treat w/ valtrex and Tramadol as pain relief.      Tobey Grim, MD 01/31/15 [phone]

## 2015-01-31 NOTE — Discharge Instructions (Signed)
Take the Valtrex 1 pill three times a day for 7 days.  Take the Tramadol up to every 8 hours for pain if you need it.  If it's mild pain, you can take Ibuprofen.   Follow up with your regular doctor in a week.  You can discuss the shingles shot after you've recovered from this.

## 2015-04-17 ENCOUNTER — Other Ambulatory Visit: Payer: Self-pay | Admitting: Family Medicine

## 2015-05-05 ENCOUNTER — Other Ambulatory Visit (HOSPITAL_COMMUNITY): Payer: Self-pay | Admitting: Gastroenterology

## 2015-05-05 DIAGNOSIS — R112 Nausea with vomiting, unspecified: Secondary | ICD-10-CM

## 2015-05-12 ENCOUNTER — Ambulatory Visit (HOSPITAL_COMMUNITY)
Admission: RE | Admit: 2015-05-12 | Discharge: 2015-05-12 | Disposition: A | Discharge status: Home / Self Care | Payer: Federal, State, Local not specified - PPO | Source: Ambulatory Visit | Attending: Gastroenterology | Admitting: Gastroenterology

## 2015-05-12 DIAGNOSIS — K219 Gastro-esophageal reflux disease without esophagitis: Secondary | ICD-10-CM | POA: Diagnosis not present

## 2015-05-12 DIAGNOSIS — R112 Nausea with vomiting, unspecified: Secondary | ICD-10-CM | POA: Diagnosis not present

## 2015-05-12 MED ORDER — TECHNETIUM TC 99M SULFUR COLLOID
2.0000 | Freq: Once | INTRAVENOUS | Status: DC | PRN
  Administered 2015-05-12: 2 via INTRAVENOUS
  Filled 2015-05-12: qty 2

## 2015-05-13 ENCOUNTER — Other Ambulatory Visit (HOSPITAL_COMMUNITY): Payer: Self-pay | Admitting: Gastroenterology

## 2015-08-25 ENCOUNTER — Other Ambulatory Visit: Payer: Self-pay | Admitting: Family Medicine

## 2015-09-11 ENCOUNTER — Encounter (HOSPITAL_COMMUNITY): Payer: Self-pay | Admitting: Emergency Medicine

## 2015-09-11 ENCOUNTER — Emergency Department (HOSPITAL_COMMUNITY)
Admission: EM | Admit: 2015-09-11 | Discharge: 2015-09-11 | Disposition: A | Discharge status: Home / Self Care | Payer: Federal, State, Local not specified - PPO | Attending: Emergency Medicine | Admitting: Emergency Medicine

## 2015-09-11 DIAGNOSIS — R1110 Vomiting, unspecified: Secondary | ICD-10-CM | POA: Diagnosis present

## 2015-09-11 DIAGNOSIS — R05 Cough: Secondary | ICD-10-CM | POA: Diagnosis not present

## 2015-09-11 DIAGNOSIS — Z8673 Personal history of transient ischemic attack (TIA), and cerebral infarction without residual deficits: Secondary | ICD-10-CM | POA: Insufficient documentation

## 2015-09-11 DIAGNOSIS — G43909 Migraine, unspecified, not intractable, without status migrainosus: Secondary | ICD-10-CM | POA: Insufficient documentation

## 2015-09-11 DIAGNOSIS — R6883 Chills (without fever): Secondary | ICD-10-CM | POA: Diagnosis not present

## 2015-09-11 DIAGNOSIS — R197 Diarrhea, unspecified: Secondary | ICD-10-CM | POA: Insufficient documentation

## 2015-09-11 DIAGNOSIS — R1084 Generalized abdominal pain: Secondary | ICD-10-CM | POA: Insufficient documentation

## 2015-09-11 DIAGNOSIS — Z79899 Other long term (current) drug therapy: Secondary | ICD-10-CM | POA: Insufficient documentation

## 2015-09-11 DIAGNOSIS — F319 Bipolar disorder, unspecified: Secondary | ICD-10-CM | POA: Diagnosis not present

## 2015-09-11 MED ORDER — SODIUM CHLORIDE 0.9 % IV BOLUS (SEPSIS)
1000.0000 mL | Freq: Once | INTRAVENOUS | Status: AC
  Administered 2015-09-11: 1000 mL via INTRAVENOUS

## 2015-09-11 MED ORDER — ONDANSETRON 8 MG PO TBDP: 8.0000 mg | ORAL_TABLET | Freq: Three times a day (TID) | ORAL | Status: DC | PRN

## 2015-09-11 MED ORDER — ONDANSETRON HCL 4 MG/2ML IJ SOLN
4.0000 mg | Freq: Once | INTRAMUSCULAR | Status: AC
  Administered 2015-09-11: 4 mg via INTRAVENOUS
  Filled 2015-09-11: qty 2

## 2015-09-11 MED ORDER — ONDANSETRON 4 MG PO TBDP
8.0000 mg | ORAL_TABLET | Freq: Once | ORAL | Status: AC
  Administered 2015-09-11: 8 mg via ORAL
  Filled 2015-09-11: qty 2

## 2015-09-11 MED ORDER — FENTANYL CITRATE (PF) 100 MCG/2ML IJ SOLN
50.0000 ug | Freq: Once | INTRAMUSCULAR | Status: AC
  Administered 2015-09-11: 50 ug via INTRAVENOUS
  Filled 2015-09-11: qty 2

## 2015-09-11 MED ORDER — LOPERAMIDE HCL 2 MG PO CAPS
2.0000 mg | ORAL_CAPSULE | Freq: Once | ORAL | Status: AC
  Administered 2015-09-11: 2 mg via ORAL
  Filled 2015-09-11: qty 1

## 2015-09-11 NOTE — ED Provider Notes (Signed)
CSN: [id number]     Arrival date & time 09/11/15  0451 History   First MD Initiated Contact with Patient 09/11/15 0454     Chief Complaint  Patient presents with  . Abdominal Pain  . Emesis  . Diarrhea    Patient is a 45 y.o. female presenting with abdominal pain, vomiting, and diarrhea. The history is provided by the patient.  Abdominal Pain Pain location:  Generalized Pain quality: cramping   Pain severity:  Moderate Onset quality:  Sudden Timing:  Constant Progression:  Worsening Chronicity:  New Relieved by:  Nothing Worsened by:  Nothing tried Associated symptoms: chills, cough, diarrhea and vomiting   Associated symptoms: no chest pain and no fever   Emesis Associated symptoms: abdominal pain, chills and diarrhea   Diarrhea Associated symptoms: abdominal pain, chills and vomiting   Associated symptoms: no fever   pt reports onset of abd cramping/vomiting/diarrhea several hours Denies blood in her emesis No recent travel or sick contacts She had otherwise been well yesterday   Past Medical History  Diagnosis Date  . Migraine     "not as often anymore; take RX qd" (01/16/2014)  . Stroke [facility]) ~ 2006    "light"  . Bipolar disorder (HCC)     "very low stage" (01/16/2014)   Past Surgical History  Procedure Laterality Date  . Tonsillectomy    . Tubal ligation    . Bunionectomy Bilateral   . Refractive surgery Bilateral ~ 2001   Family History  Problem Relation Age of Onset  . CAD Father   . CAD Paternal Grandmother    Social History  Substance Use Topics  . Smoking status: Never Smoker   . Smokeless tobacco: Never Used  . Alcohol Use: 4.2 oz/week    7 Cans of beer per week     Comment: "@ least 1 beer/day"   OB History    No data available     Review of Systems  Constitutional: Positive for chills. Negative for fever.  Respiratory: Positive for cough.   Cardiovascular: Negative for chest pain.  Gastrointestinal: Positive for vomiting, abdominal pain  and diarrhea.  All other systems reviewed and are negative.     Allergies  Aspirin  Home Medications   Prior to Admission medications   Medication Sig Start Date End Date Taking? Authorizing Provider  ALPRAZolam Prudy Feeler) 0.5 MG tablet Take 0.5 mg by mouth 4 (four) times daily as needed for anxiety.  11/26/13   Historical Provider, MD  atorvastatin (LIPITOR) 40 MG tablet Take 1 tablet (40 mg total) by mouth daily at 6 PM. 01/17/14   Brittainy M Sharol Harness, PA-C  citalopram (CELEXA) 20 MG tablet Take 20 mg by mouth daily. 12/26/13   Historical Provider, MD  escitalopram (LEXAPRO) 10 MG tablet Take 10 mg by mouth daily.    Historical Provider, MD  lamoTRIgine (LAMICTAL) 200 MG tablet Take 200 mg by mouth at bedtime.    Historical Provider, MD  perphenazine (TRILAFON) 2 MG tablet Take 2 mg by mouth daily. 12/26/13   Historical Provider, MD  propranolol (INDERAL) 10 MG tablet Take 10 mg by mouth QID.  01/03/14   Historical Provider, MD  traMADol (ULTRAM) 50 MG tablet Take 1 tablet (50 mg total) by mouth every 6 (six) hours as needed. 01/31/15   Tobey Grim, MD   BP 137/94 mmHg  Pulse 97  Temp(Src) 97.9 F (36.6 C)  Resp 24  SpO2 99%  LMP 09/11/2015 Physical Exam CONSTITUTIONAL: Well developed/well nourished  HEAD: Normocephalic/atraumatic EYES: EOMI, no icterus ENMT: Mucous membranes dry NECK: supple no meningeal signs CV: S1/S2 noted, no murmurs/rubs/gallops noted LUNGS: Lungs are clear to auscultation bilaterally, no apparent distress ABDOMEN: soft, diffuse mild tenderness, no rebound or guarding, bowel sounds noted throughout abdomen NEURO: Pt is awake/alert/appropriate, moves all extremitiesx4.  No facial droop.   EXTREMITIES: pulses normal/equal, full ROM SKIN: warm, color normal PSYCH: no abnormalities of mood noted, alert and oriented to situation  ED Course  Procedures  6:33 AM Pt with probable viral gastroenteritis She had another episode of vomiting/diarrhea here Another  rounds of meds/fluids ordered Her abd pain is improving 7:43 AM Pt improving Taking ice chips Probable viral GE (no travel, no antibiotics, does not work in healthcare) Will give one dose of imodium Rx for zofran Stable for d/c home BP 132/73 mmHg  Pulse 97  Temp(Src) 97.9 F (36.6 C)  Resp 24  SpO2 97%  LMP 09/11/2015  Medications  loperamide (IMODIUM) capsule 2 mg (not administered)  ondansetron (ZOFRAN-ODT) disintegrating tablet 8 mg (8 mg Oral Given 09/11/15 0517)  sodium chloride 0.9 % bolus 1,000 mL (0 mLs Intravenous Stopped 09/11/15 0638)  ondansetron (ZOFRAN) injection 4 mg (4 mg Intravenous Given 09/11/15 0523)  fentaNYL (SUBLIMAZE) injection 50 mcg (50 mcg Intravenous Given 09/11/15 0523)  sodium chloride 0.9 % bolus 1,000 mL (1,000 mLs Intravenous New Bag/Given 09/11/15 0639)  ondansetron (ZOFRAN) injection 4 mg (4 mg Intravenous Given 09/11/15 9604)     MDM   Final diagnoses:  Vomiting and diarrhea    Nursing notes including past medical history and social history reviewed and considered in documentation     Zadie Rhine, MD 09/11/15 0745

## 2015-09-11 NOTE — ED Notes (Signed)
Pt presents from home with periumbilical abd pain with N/V/D; pt denies fevers or being around others who are sick;

## 2015-09-11 NOTE — ED Notes (Signed)
Stool sample saved and labeled at the bedside

## 2015-09-11 NOTE — ED Notes (Signed)
PO challenge-liquids given to pt to attempt

## 2015-09-11 NOTE — ED Notes (Signed)
Pt ambulated with RN, tolerated well.

## 2015-12-21 DIAGNOSIS — F410 Panic disorder [episodic paroxysmal anxiety] without agoraphobia: Secondary | ICD-10-CM | POA: Diagnosis not present

## 2015-12-21 DIAGNOSIS — F3130 Bipolar disorder, current episode depressed, mild or moderate severity, unspecified: Secondary | ICD-10-CM | POA: Diagnosis not present

## 2016-01-21 ENCOUNTER — Emergency Department (HOSPITAL_COMMUNITY): Payer: Federal, State, Local not specified - PPO

## 2016-01-21 ENCOUNTER — Emergency Department (HOSPITAL_COMMUNITY)
Admission: EM | Admit: 2016-01-21 | Discharge: 2016-01-21 | Disposition: A | Discharge status: Home / Self Care | Payer: Federal, State, Local not specified - PPO | Attending: Emergency Medicine | Admitting: Emergency Medicine

## 2016-01-21 ENCOUNTER — Encounter (HOSPITAL_COMMUNITY): Payer: Self-pay | Admitting: Family Medicine

## 2016-01-21 DIAGNOSIS — Z79899 Other long term (current) drug therapy: Secondary | ICD-10-CM | POA: Insufficient documentation

## 2016-01-21 DIAGNOSIS — K859 Acute pancreatitis without necrosis or infection, unspecified: Secondary | ICD-10-CM | POA: Diagnosis not present

## 2016-01-21 DIAGNOSIS — Z8673 Personal history of transient ischemic attack (TIA), and cerebral infarction without residual deficits: Secondary | ICD-10-CM | POA: Diagnosis not present

## 2016-01-21 DIAGNOSIS — Z791 Long term (current) use of non-steroidal anti-inflammatories (NSAID): Secondary | ICD-10-CM | POA: Diagnosis not present

## 2016-01-21 DIAGNOSIS — R1013 Epigastric pain: Secondary | ICD-10-CM | POA: Diagnosis not present

## 2016-01-21 DIAGNOSIS — R1011 Right upper quadrant pain: Secondary | ICD-10-CM | POA: Diagnosis not present

## 2016-01-21 LAB — CBC
HCT: 40.1 % (ref 36.0–46.0)
HEMOGLOBIN: 13.6 g/dL (ref 12.0–15.0)
MCH: 29 pg (ref 26.0–34.0)
MCHC: 33.9 g/dL (ref 30.0–36.0)
MCV: 85.5 fL (ref 78.0–100.0)
PLATELETS: 266 10*3/uL (ref 150–400)
RBC: 4.69 MIL/uL (ref 3.87–5.11)
RDW: 13.1 % (ref 11.5–15.5)
WBC: 11.1 10*3/uL — ABNORMAL HIGH (ref 4.0–10.5)

## 2016-01-21 LAB — COMPREHENSIVE METABOLIC PANEL
ALBUMIN: 4.2 g/dL (ref 3.5–5.0)
ALK PHOS: 56 U/L (ref 38–126)
ALT: 17 U/L (ref 14–54)
ANION GAP: 7 (ref 5–15)
AST: 18 U/L (ref 15–41)
BILIRUBIN TOTAL: 0.7 mg/dL (ref 0.3–1.2)
BUN: 11 mg/dL (ref 6–20)
CALCIUM: 9.2 mg/dL (ref 8.9–10.3)
CO2: 26 mmol/L (ref 22–32)
CREATININE: 0.98 mg/dL (ref 0.44–1.00)
Chloride: 104 mmol/L (ref 101–111)
GFR calc Af Amer: >60 mL/min (ref >60)
GFR calc non Af Amer: >60 mL/min (ref >60)
Glucose, Bld: 96 mg/dL (ref 65–99)
Potassium: 4 mmol/L (ref 3.5–5.1)
Sodium: 137 mmol/L (ref 135–145)
TOTAL PROTEIN: 6.8 g/dL (ref 6.5–8.1)

## 2016-01-21 LAB — URINALYSIS, ROUTINE W REFLEX MICROSCOPIC
BILIRUBIN URINE: NEGATIVE
Glucose, UA: NEGATIVE mg/dL
HGB URINE DIPSTICK: NEGATIVE
KETONES UR: NEGATIVE mg/dL
Leukocytes, UA: NEGATIVE
NITRITE: NEGATIVE
PROTEIN: NEGATIVE mg/dL
Specific Gravity, Urine: 1.008 (ref 1.005–1.030)
pH: 7.5 (ref 5.0–8.0)

## 2016-01-21 LAB — I-STAT BETA HCG BLOOD, ED (MC, WL, AP ONLY)

## 2016-01-21 LAB — LIPASE, BLOOD: Lipase: 95 U/L — ABNORMAL HIGH (ref 11–51)

## 2016-01-21 MED ORDER — MORPHINE SULFATE (PF) 4 MG/ML IV SOLN
4.0000 mg | Freq: Once | INTRAVENOUS | Status: AC
  Administered 2016-01-21: 4 mg via INTRAVENOUS
  Filled 2016-01-21: qty 1

## 2016-01-21 MED ORDER — IOPAMIDOL (ISOVUE-300) INJECTION 61%
INTRAVENOUS | Status: AC
  Administered 2016-01-21: 100 mL
  Filled 2016-01-21: qty 100

## 2016-01-21 MED ORDER — ONDANSETRON 4 MG PO TBDP: 4.0000 mg | ORAL_TABLET | ORAL | Status: DC | PRN

## 2016-01-21 MED ORDER — HYDROCODONE-ACETAMINOPHEN 5-325 MG PO TABS: 1.0000 | ORAL_TABLET | ORAL | Status: DC | PRN

## 2016-01-21 MED ORDER — FAMOTIDINE IN NACL 20-0.9 MG/50ML-% IV SOLN
20.0000 mg | Freq: Once | INTRAVENOUS | Status: AC
  Administered 2016-01-21: 20 mg via INTRAVENOUS
  Filled 2016-01-21: qty 50

## 2016-01-21 MED ORDER — PANTOPRAZOLE SODIUM 40 MG PO TBEC
40.0000 mg | DELAYED_RELEASE_TABLET | Freq: Once | ORAL | Status: AC
  Administered 2016-01-21: 40 mg via ORAL
  Filled 2016-01-21: qty 1

## 2016-01-21 NOTE — ED Notes (Signed)
Patient verbalized understanding of discharge instructions and denies any further needs or questions at this time. VS stable. Patient ambulatory with steady gait, assisted to ED entrance in wheelchair.  

## 2016-01-21 NOTE — ED Provider Notes (Signed)
CSN: [id number]     Arrival date & time 01/21/16  4098 History   First MD Initiated Contact with Patient 01/21/16 1009     Chief Complaint  Patient presents with  . Abdominal Pain     (Consider location/radiation/quality/duration/timing/severity/associated sxs/prior Treatment) HPI Patient reports she ate dinner earlier yesterday evening. She routinely it's early because she has problems with hiatal hernia and reflux. Around 4 PM she had some cream chicken and mashed potatoes. She reports later that night she started getting severe pain in her epigastrium with nausea. She reports she did not vomit. She reports the pain was intense and would double her over at times. She reports she spent most of night up pacing around. He has improved slightly now. She reports now she can actually rest a little bit. She thinks maybe is because she tried 800 of ibuprofen this morning and maybe now is working. She takes omeprazole daily. She denies any known problem with her gallbladder. She was not sick leading up to this. Past Medical History  Diagnosis Date  . Migraine     "not as often anymore; take RX qd" (01/16/2014)  . Stroke [facility]) ~ 2006    "light"  . Bipolar disorder (HCC)     "very low stage" (01/16/2014)   Past Surgical History  Procedure Laterality Date  . Tonsillectomy    . Tubal ligation    . Bunionectomy Bilateral   . Refractive surgery Bilateral ~ 2001   Family History  Problem Relation Age of Onset  . CAD Father   . CAD Paternal Grandmother    Social History  Substance Use Topics  . Smoking status: Never Smoker   . Smokeless tobacco: Never Used  . Alcohol Use: 4.2 oz/week    7 Cans of beer per week     Comment: "@ least 1 beer/day"   OB History    No data available     Review of Systems  10 Systems reviewed and are negative for acute change except as noted in the HPI.  Allergies  Aspirin  Home Medications   Prior to Admission medications   Medication Sig Start Date End  Date Taking? Authorizing Provider  ALPRAZolam Prudy Feeler) 0.5 MG tablet Take 0.5 mg by mouth 4 (four) times daily as needed for anxiety.  11/26/13  Yes Historical Provider, MD  citalopram (CELEXA) 20 MG tablet Take 20 mg by mouth daily. 12/26/13  Yes Historical Provider, MD  ibuprofen (ADVIL,MOTRIN) 200 MG tablet Take 600 mg by mouth every 6 (six) hours as needed (pain).   Yes Historical Provider, MD  lamoTRIgine (LAMICTAL) 200 MG tablet Take 200 mg by mouth at bedtime.   Yes Historical Provider, MD  Multiple Vitamins-Minerals (MULTIVITAL PO) Take 1 tablet by mouth daily.   Yes Historical Provider, MD  omeprazole (PRILOSEC) 40 MG capsule Take 40 mg by mouth daily. 01/14/16  Yes Historical Provider, MD  perphenazine (TRILAFON) 2 MG tablet Take 2 mg by mouth daily. 12/26/13  Yes Historical Provider, MD  propranolol (INDERAL) 10 MG tablet Take 10 mg by mouth 4 (four) times daily as needed (anxiety).  01/03/14  Yes Historical Provider, MD  HYDROcodone-acetaminophen (NORCO/VICODIN) 5-325 MG tablet Take 1-2 tablets by mouth every 4 (four) hours as needed for moderate pain or severe pain. 01/21/16   Arby Barrette, MD  ondansetron (ZOFRAN ODT) 4 MG disintegrating tablet Take 1 tablet (4 mg total) by mouth every 4 (four) hours as needed for nausea or vomiting. 01/21/16   Arby Barrette,  MD   BP 136/91 mmHg  Pulse 69  Temp(Src) 98.2 F (36.8 C) (Oral)  Resp 16  Ht  (1.676 m)  Wt 179 lb 6.4 oz (81.375 kg)  BMI 28.97 kg/m2  SpO2 97%  LMP 01/12/2016 Physical Exam  Constitutional: She is oriented to person, place, and time. She appears well-developed and well-nourished.  HENT:  Head: Normocephalic and atraumatic.  Mouth/Throat: Oropharynx is clear and moist.  Eyes: EOM are normal. Pupils are equal, round, and reactive to light.  Neck: Neck supple.  Cardiovascular: Normal rate, regular rhythm, normal heart sounds and intact distal pulses.   Pulmonary/Chest: Effort normal and breath sounds normal.  Abdominal:  Soft. Bowel sounds are normal. She exhibits no distension. There is tenderness.  Moderate to severe epigastric and right upper quadrant tenderness. Lower abdomen is nontender without guarding.  Musculoskeletal: Normal range of motion. She exhibits no edema or tenderness.  Neurological: She is alert and oriented to person, place, and time. She has normal strength. Coordination normal. GCS eye subscore is 4. GCS verbal subscore is 5. GCS motor subscore is 6.  Skin: Skin is warm, dry and intact.  Psychiatric: She has a normal mood and affect.    ED Course  Procedures (including critical care time) Labs Review Labs Reviewed  LIPASE, BLOOD - Abnormal; Notable for the following:    Lipase 95 (*)    All other components within normal limits  CBC - Abnormal; Notable for the following:    WBC 11.1 (*)    All other components within normal limits  COMPREHENSIVE METABOLIC PANEL  URINALYSIS, ROUTINE W REFLEX MICROSCOPIC (NOT AT [facility])  I-STAT BETA HCG BLOOD, ED ([facility], [facility], [facility] ONLY)    Imaging Review US Abdomen Complete  01/21/2016  CLINICAL DATA:  Right upper quadrant and epigastric pain since last night. EXAM: ABDOMEN ULTRASOUND COMPLETE COMPARISON:  Abdomen ultrasound dated 04/04/2014. FINDINGS: Gallbladder: No gallstones seen. There is no gallbladder wall thickening, pericholecystic fluid or other secondary sonographic signs of acute cholecystitis. No sonographic Murphy's sign elicited during the examination, per the sonographer. Common bile duct: Diameter: Normal at 1.9 mm Liver: No focal lesion identified. Within normal limits in parenchymal echogenicity. IVC: No abnormality visualized. Pancreas: Visualized portion unremarkable. Spleen: Size and appearance within normal limits. Right Kidney: Length: 10.2 cm. Echogenicity within normal limits. No mass or hydronephrosis visualized. Left Kidney: Length: 11.2 cm. Echogenicity within normal limits. No mass or hydronephrosis visualized. Abdominal aorta: No  aneurysm visualized. Aortic bifurcation obscured by overlying bowel gas. Other findings: None. IMPRESSION: Normal abdomen ultrasound. Aortic bifurcation obscured by overlying bowel gas. Electronically Signed   By: Bary Richard M.D.   On: 01/21/2016 13:04   Ct Abdomen Pelvis W Contrast  01/21/2016  CLINICAL DATA:  Epigastric pain.  Sharp stabbing pain with nausea. EXAM: CT ABDOMEN AND PELVIS WITH CONTRAST TECHNIQUE: Multidetector CT imaging of the abdomen and pelvis was performed using the standard protocol following bolus administration of intravenous contrast. CONTRAST:  ISOVUE-300 IOPAMIDOL (ISOVUE-300) INJECTION 61% COMPARISON:  None. FINDINGS: Lower chest:  Bibasilar atelectasis.  Normal heart size. Hepatobiliary: Normal liver.  Normal gallbladder. Pancreas: Normal. Spleen: Normal. Adrenals/Urinary Tract: Normal adrenal glands. Punctate nonobstructing right renal calculus. No obstructive uropathy. Mild renal cortical scarring of the upper pole of the right kidney. Stomach/Bowel: No bowel wall thickening or dilatation. No pneumatosis, pneumoperitoneum or portal venous gas. Moderate amount of stool throughout the colon. No abdominal or pelvic free fluid. Vascular/Lymphatic: Normal caliber abdominal aorta. No lymphadenopathy. Reproductive: Normal  uterus.  No adnexal mass. Other: No fluid collection or hematoma. Musculoskeletal: No acute osseous abnormality. No lytic or sclerotic osseous lesion. Mild degenerative changes of bilateral SI joints. IMPRESSION: No acute abdominal or pelvic pathology. Electronically Signed   By: Elige KoHetal  Patel   On: 01/21/2016 16:46   I have personally reviewed and evaluated these images and lab results as part of my medical decision-making.   EKG Interpretation None      MDM   Final diagnoses:  Acute pancreatitis, unspecified pancreatitis type   Patient presents with severe epigastric pain onset yesterday evening. US and CT abdomen without acute pathology. Patient  does have mild increase in lipase. Counseled on outpatient management of pancreatitis. Counseled on signs and symptoms to return. Given vicodin and zofran for symptoms control.    Arby BarretteMarcy Chinyere Galiano, MD 01/21/16 [id number]

## 2016-01-21 NOTE — ED Notes (Signed)
Pt presents with onset of epigastric pain that began last night.  Pt denies injury, reports pumping up 2 floats yesterday afternoon, reports headache and general "not feeling good" yesterday as well. +nausea, denies vomiting.

## 2016-01-21 NOTE — ED Notes (Addendum)
Pt here for upper abd pain, sharp and states worse with coughing and moving. sts some nausea. sts she has a hiatal hernia and takes omeprazole daily.

## 2016-01-21 NOTE — Discharge Instructions (Signed)

## 2016-01-25 ENCOUNTER — Other Ambulatory Visit (HOSPITAL_COMMUNITY): Payer: Self-pay | Admitting: Gastroenterology

## 2016-01-25 DIAGNOSIS — R1013 Epigastric pain: Secondary | ICD-10-CM

## 2016-01-27 ENCOUNTER — Ambulatory Visit (HOSPITAL_COMMUNITY)
Admission: RE | Admit: 2016-01-27 | Discharge: 2016-01-27 | Disposition: A | Discharge status: Home / Self Care | Payer: Federal, State, Local not specified - PPO | Source: Ambulatory Visit | Attending: Gastroenterology | Admitting: Gastroenterology

## 2016-01-27 DIAGNOSIS — R1013 Epigastric pain: Secondary | ICD-10-CM | POA: Insufficient documentation

## 2016-01-27 MED ORDER — TECHNETIUM TC 99M MEBROFENIN IV KIT: 5.5000 | PACK | Freq: Once | INTRAVENOUS | Status: DC | PRN

## 2016-02-02 ENCOUNTER — Encounter (HOSPITAL_COMMUNITY): Payer: Federal, State, Local not specified - PPO

## 2016-03-29 DIAGNOSIS — K080 Exfoliation of teeth due to systemic causes: Secondary | ICD-10-CM | POA: Diagnosis not present

## 2016-04-04 DIAGNOSIS — M542 Cervicalgia: Secondary | ICD-10-CM | POA: Diagnosis not present

## 2016-04-04 DIAGNOSIS — M62838 Other muscle spasm: Secondary | ICD-10-CM | POA: Diagnosis not present

## 2016-04-11 DIAGNOSIS — M542 Cervicalgia: Secondary | ICD-10-CM | POA: Diagnosis not present

## 2016-04-13 DIAGNOSIS — M542 Cervicalgia: Secondary | ICD-10-CM | POA: Diagnosis not present

## 2016-04-20 DIAGNOSIS — K080 Exfoliation of teeth due to systemic causes: Secondary | ICD-10-CM | POA: Diagnosis not present

## 2016-04-27 DIAGNOSIS — K080 Exfoliation of teeth due to systemic causes: Secondary | ICD-10-CM | POA: Diagnosis not present

## 2016-05-16 DIAGNOSIS — M5412 Radiculopathy, cervical region: Secondary | ICD-10-CM | POA: Diagnosis not present

## 2016-05-17 DIAGNOSIS — M5412 Radiculopathy, cervical region: Secondary | ICD-10-CM | POA: Diagnosis not present

## 2016-05-19 DIAGNOSIS — M5412 Radiculopathy, cervical region: Secondary | ICD-10-CM | POA: Diagnosis not present

## 2016-05-23 DIAGNOSIS — M5412 Radiculopathy, cervical region: Secondary | ICD-10-CM | POA: Diagnosis not present

## 2016-05-24 DIAGNOSIS — M5412 Radiculopathy, cervical region: Secondary | ICD-10-CM | POA: Diagnosis not present

## 2016-05-26 DIAGNOSIS — M5412 Radiculopathy, cervical region: Secondary | ICD-10-CM | POA: Diagnosis not present

## 2016-06-01 DIAGNOSIS — M5412 Radiculopathy, cervical region: Secondary | ICD-10-CM | POA: Diagnosis not present

## 2016-06-06 DIAGNOSIS — M5412 Radiculopathy, cervical region: Secondary | ICD-10-CM | POA: Diagnosis not present

## 2016-06-22 DIAGNOSIS — K080 Exfoliation of teeth due to systemic causes: Secondary | ICD-10-CM | POA: Diagnosis not present

## 2016-06-28 DIAGNOSIS — K080 Exfoliation of teeth due to systemic causes: Secondary | ICD-10-CM | POA: Diagnosis not present

## 2016-08-17 DIAGNOSIS — K080 Exfoliation of teeth due to systemic causes: Secondary | ICD-10-CM | POA: Diagnosis not present

## 2016-09-23 ENCOUNTER — Other Ambulatory Visit (HOSPITAL_COMMUNITY)
Admission: RE | Admit: 2016-09-23 | Discharge: 2016-09-23 | Disposition: A | Discharge status: Home / Self Care | Payer: Federal, State, Local not specified - PPO | Source: Ambulatory Visit | Attending: Family Medicine | Admitting: Family Medicine

## 2016-09-23 ENCOUNTER — Other Ambulatory Visit: Payer: Self-pay | Admitting: Family Medicine

## 2016-09-23 DIAGNOSIS — Z124 Encounter for screening for malignant neoplasm of cervix: Secondary | ICD-10-CM | POA: Diagnosis not present

## 2016-09-23 DIAGNOSIS — E7800 Pure hypercholesterolemia, unspecified: Secondary | ICD-10-CM | POA: Diagnosis not present

## 2016-09-23 DIAGNOSIS — Z0000 Encounter for general adult medical examination without abnormal findings: Secondary | ICD-10-CM | POA: Diagnosis not present

## 2016-09-27 LAB — CYTOLOGY - PAP: Diagnosis: NEGATIVE

## 2016-10-26 DIAGNOSIS — F3130 Bipolar disorder, current episode depressed, mild or moderate severity, unspecified: Secondary | ICD-10-CM | POA: Diagnosis not present

## 2016-10-26 DIAGNOSIS — F411 Generalized anxiety disorder: Secondary | ICD-10-CM | POA: Diagnosis not present

## 2016-10-26 DIAGNOSIS — F410 Panic disorder [episodic paroxysmal anxiety] without agoraphobia: Secondary | ICD-10-CM | POA: Diagnosis not present

## 2016-12-01 DIAGNOSIS — Z6827 Body mass index (BMI) 27.0-27.9, adult: Secondary | ICD-10-CM | POA: Diagnosis not present

## 2016-12-01 DIAGNOSIS — B9689 Other specified bacterial agents as the cause of diseases classified elsewhere: Secondary | ICD-10-CM | POA: Diagnosis not present

## 2016-12-01 DIAGNOSIS — R300 Dysuria: Secondary | ICD-10-CM | POA: Diagnosis not present

## 2016-12-01 DIAGNOSIS — R350 Frequency of micturition: Secondary | ICD-10-CM | POA: Diagnosis not present

## 2016-12-01 DIAGNOSIS — N760 Acute vaginitis: Secondary | ICD-10-CM | POA: Diagnosis not present

## 2017-01-18 DIAGNOSIS — L980 Pyogenic granuloma: Secondary | ICD-10-CM | POA: Diagnosis not present

## 2017-01-18 DIAGNOSIS — L918 Other hypertrophic disorders of the skin: Secondary | ICD-10-CM | POA: Diagnosis not present

## 2017-01-19 ENCOUNTER — Other Ambulatory Visit: Payer: Self-pay | Admitting: Family Medicine

## 2017-01-19 DIAGNOSIS — L980 Pyogenic granuloma: Secondary | ICD-10-CM | POA: Diagnosis not present

## 2017-04-13 DIAGNOSIS — F410 Panic disorder [episodic paroxysmal anxiety] without agoraphobia: Secondary | ICD-10-CM | POA: Diagnosis not present

## 2017-04-13 DIAGNOSIS — F411 Generalized anxiety disorder: Secondary | ICD-10-CM | POA: Diagnosis not present

## 2017-04-13 DIAGNOSIS — F3130 Bipolar disorder, current episode depressed, mild or moderate severity, unspecified: Secondary | ICD-10-CM | POA: Diagnosis not present

## 2017-04-19 IMAGING — US US ABDOMEN COMPLETE
1 series · 14 of 25 positions shown · non-contrast
Comparison: Abdomen ultrasound dated 04/04/2014.

CLINICAL DATA: Right upper quadrant and epigastric pain since last
night.

EXAM:
ABDOMEN ULTRASOUND COMPLETE

[Series 1: us abdomen complete · 0.20mm/px · 14 of 83 slices shown]
[im 1/83]
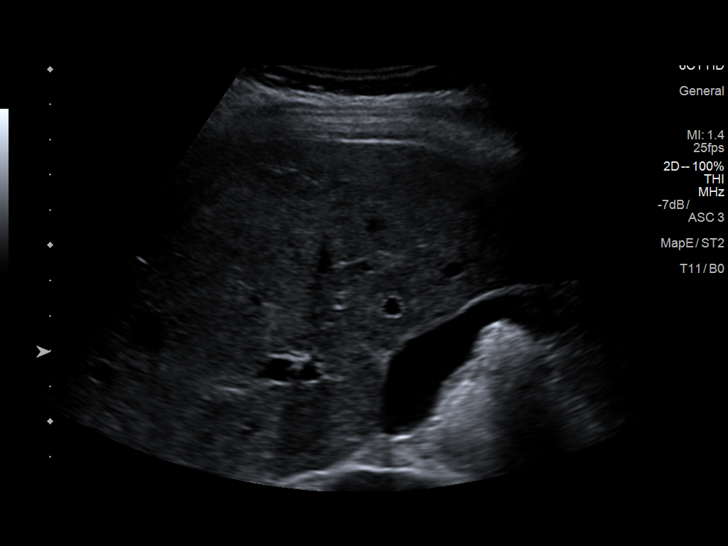
[im 7/83]
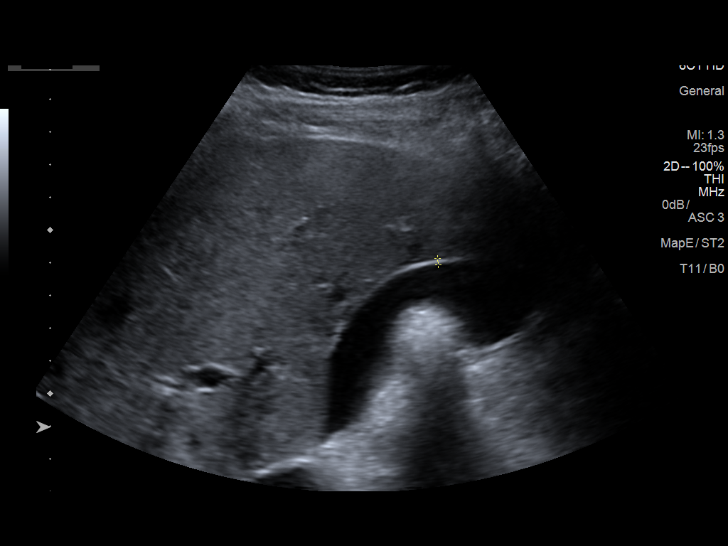
[im 14/83]
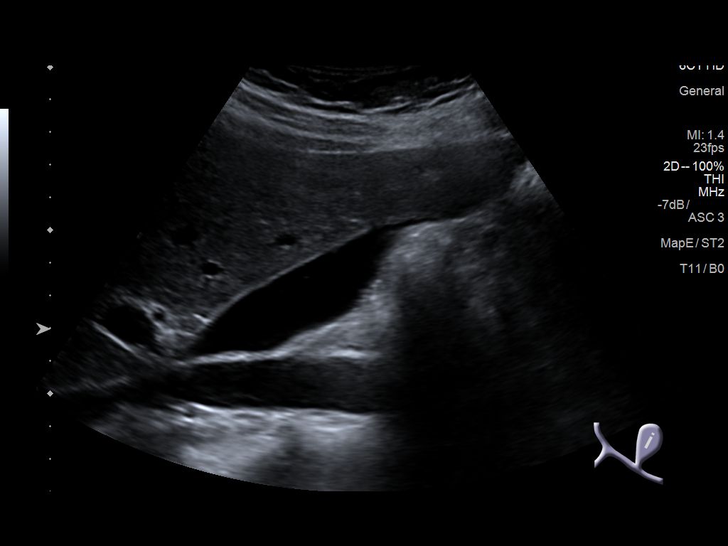
[im 21/83]
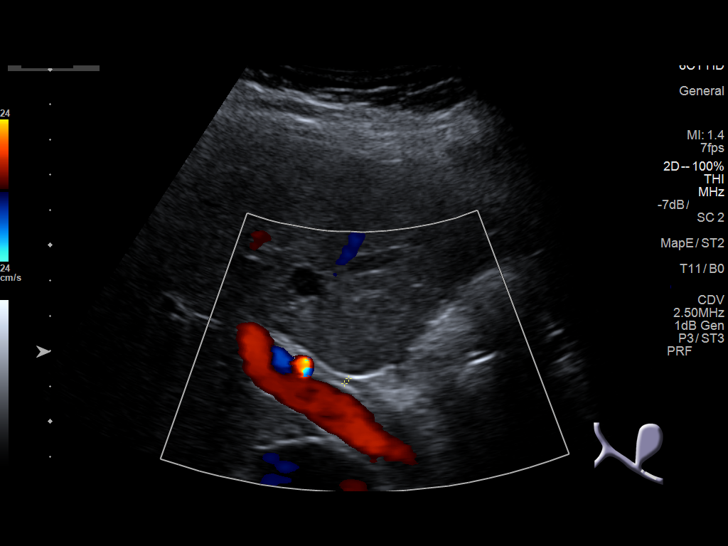
[im 28/83]
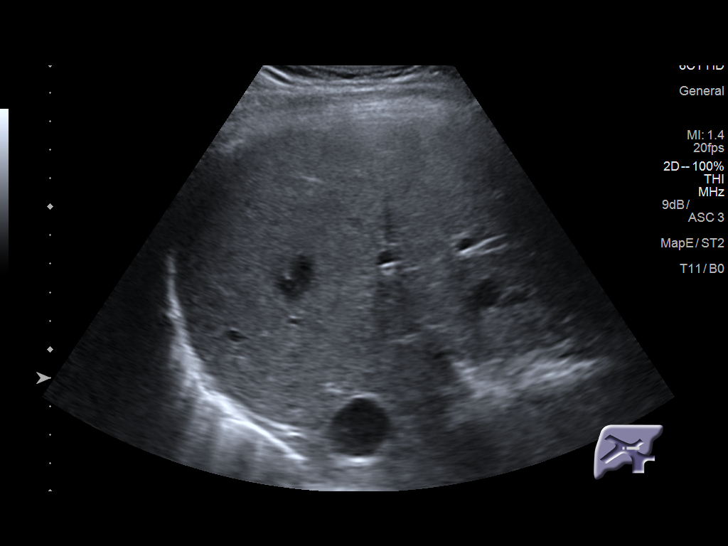
[im 31/83]
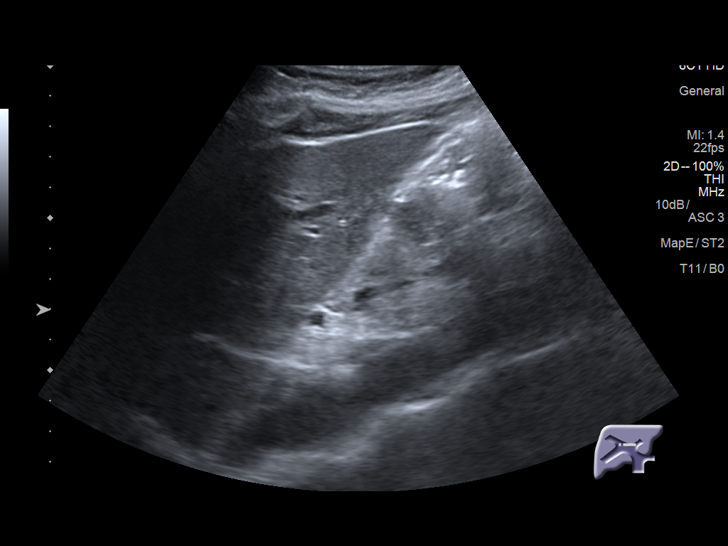
[im 38/83]
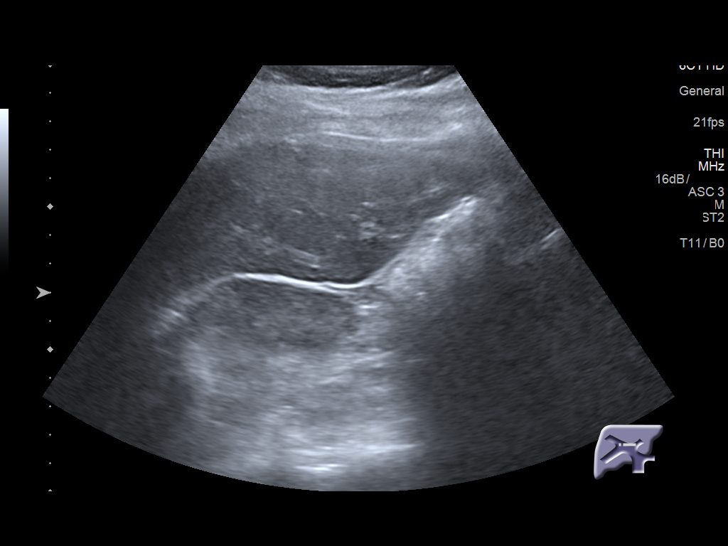
[im 45/83]
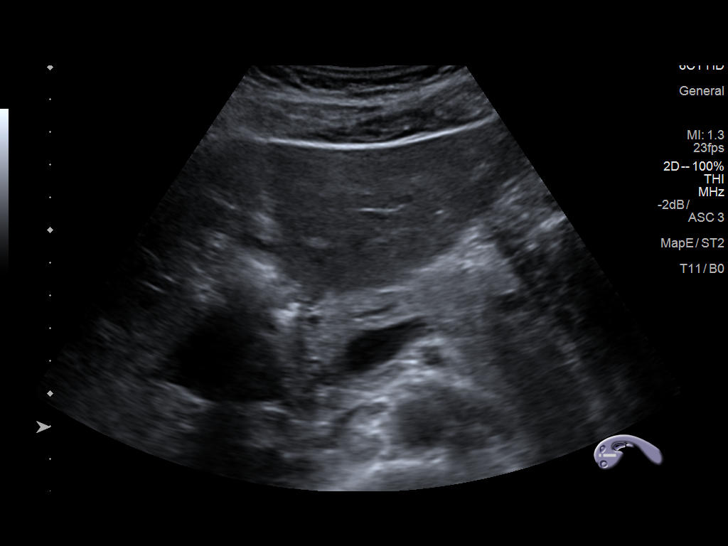
[im 52/83]
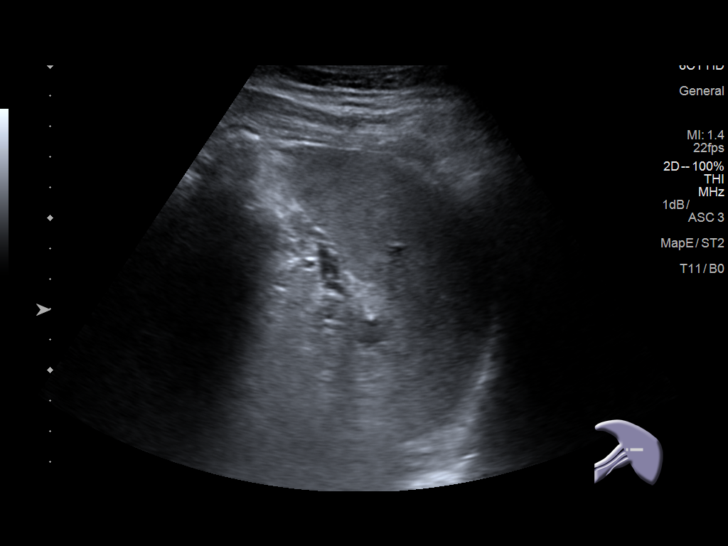
[im 55/83]
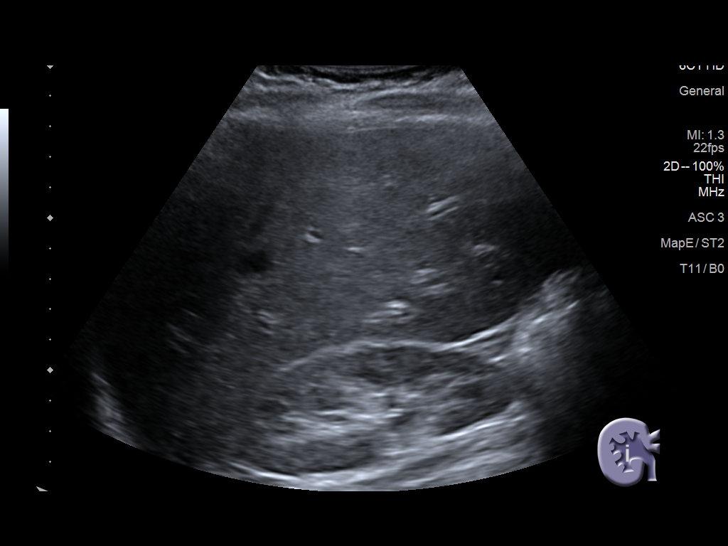
[im 62/83]
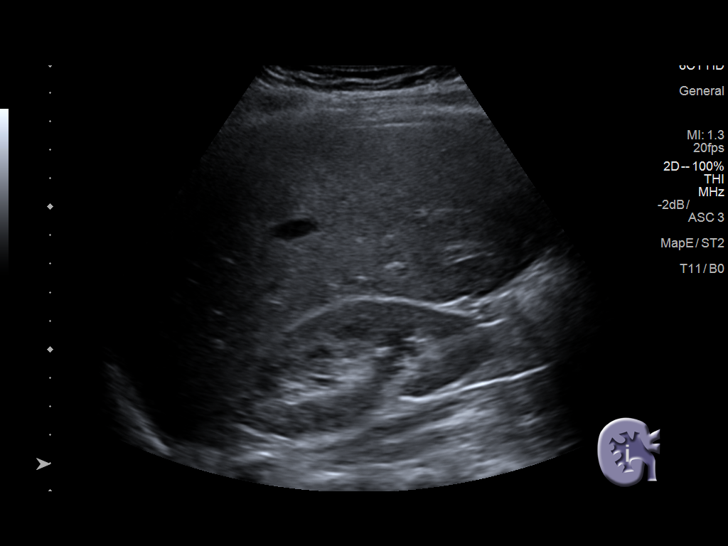
[im 69/83]
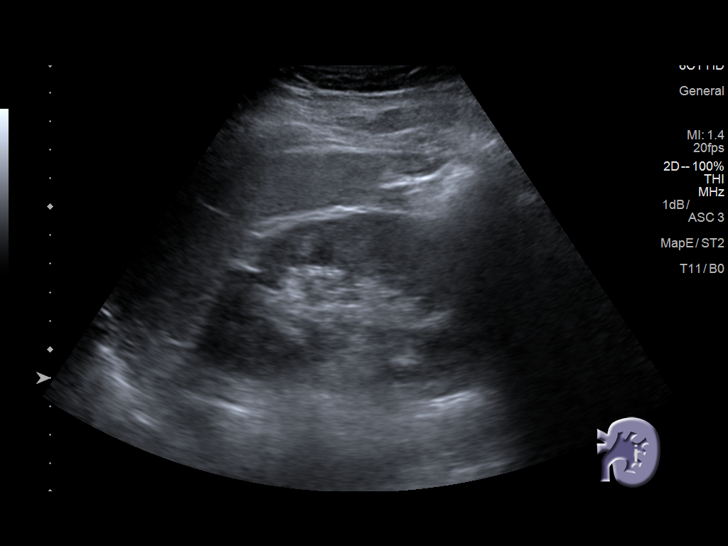
[im 76/83]
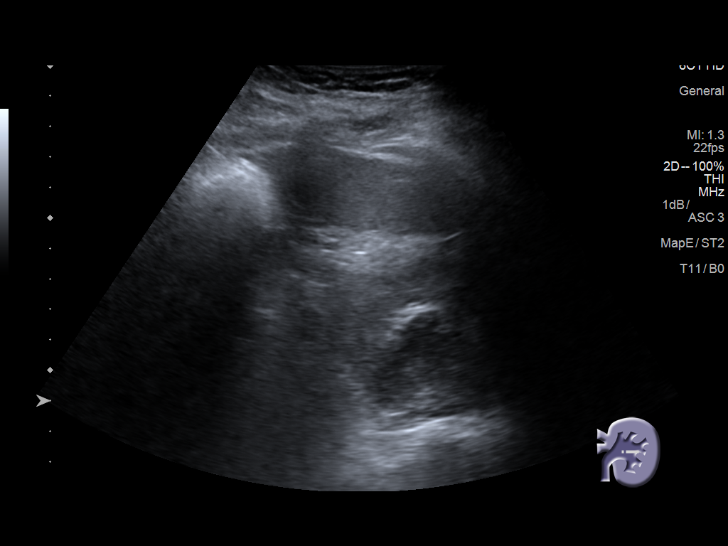
[im 83/83]
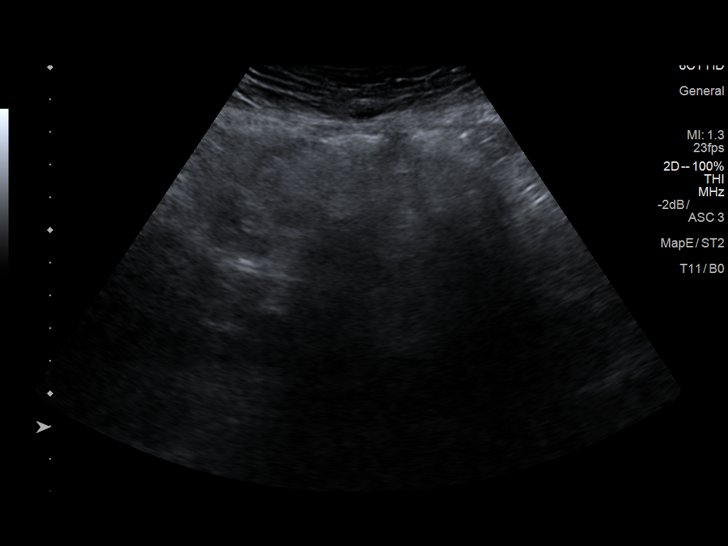

[14 of 25 positions shown; findings below may reference images not displayed]

FINDINGS: Gallbladder: No gallstones seen. There is no gallbladder wall
thickening, pericholecystic fluid or other secondary sonographic
signs of acute cholecystitis. No sonographic Murphy's sign elicited
during the examination, per the sonographer.

Common bile duct: Diameter: Normal at 1.9 mm

Liver: No focal lesion identified. Within normal limits in
parenchymal echogenicity.

IVC: No abnormality visualized.

Pancreas: Visualized portion unremarkable.

Spleen: Size and appearance within normal limits.

Right Kidney: Length: 10.2 cm. Echogenicity within normal limits. No
mass or hydronephrosis visualized.

Left Kidney: Length: 11.2 cm. Echogenicity within normal limits. No
mass or hydronephrosis visualized.

Abdominal aorta: No aneurysm visualized. Aortic bifurcation obscured
by overlying bowel gas.

Other findings: None.
IMPRESSION: Normal abdomen ultrasound. Aortic bifurcation obscured by overlying
bowel gas.

## 2017-06-01 ENCOUNTER — Ambulatory Visit (HOSPITAL_COMMUNITY)
Admission: EM | Admit: 2017-06-01 | Discharge: 2017-06-01 | Disposition: A | Discharge status: Home / Self Care | Payer: Federal, State, Local not specified - PPO | Attending: Emergency Medicine | Admitting: Emergency Medicine

## 2017-06-01 ENCOUNTER — Encounter (HOSPITAL_COMMUNITY): Payer: Self-pay | Admitting: *Deleted

## 2017-06-01 DIAGNOSIS — Z8673 Personal history of transient ischemic attack (TIA), and cerebral infarction without residual deficits: Secondary | ICD-10-CM | POA: Insufficient documentation

## 2017-06-01 DIAGNOSIS — Z79899 Other long term (current) drug therapy: Secondary | ICD-10-CM | POA: Diagnosis not present

## 2017-06-01 DIAGNOSIS — Z8249 Family history of ischemic heart disease and other diseases of the circulatory system: Secondary | ICD-10-CM | POA: Insufficient documentation

## 2017-06-01 DIAGNOSIS — N390 Urinary tract infection, site not specified: Secondary | ICD-10-CM | POA: Diagnosis not present

## 2017-06-01 DIAGNOSIS — Z886 Allergy status to analgesic agent status: Secondary | ICD-10-CM | POA: Diagnosis not present

## 2017-06-01 DIAGNOSIS — R109 Unspecified abdominal pain: Secondary | ICD-10-CM | POA: Diagnosis not present

## 2017-06-01 DIAGNOSIS — R319 Hematuria, unspecified: Secondary | ICD-10-CM | POA: Diagnosis not present

## 2017-06-01 DIAGNOSIS — F319 Bipolar disorder, unspecified: Secondary | ICD-10-CM | POA: Diagnosis not present

## 2017-06-01 LAB — POCT URINALYSIS DIP (DEVICE)
Bilirubin Urine: NEGATIVE
GLUCOSE, UA: NEGATIVE mg/dL
Ketones, ur: NEGATIVE mg/dL
NITRITE: NEGATIVE
Protein, ur: NEGATIVE mg/dL
Specific Gravity, Urine: 1.015 (ref 1.005–1.030)
UROBILINOGEN UA: 0.2 mg/dL (ref 0.0–1.0)
pH: 7.5 (ref 5.0–8.0)

## 2017-06-01 MED ORDER — LIDOCAINE HCL (PF) 1 % IJ SOLN
INTRAMUSCULAR | Status: AC
  Filled 2017-06-01: qty 2

## 2017-06-01 MED ORDER — PHENAZOPYRIDINE HCL 200 MG PO TABS: 200.0000 mg | ORAL_TABLET | Freq: Three times a day (TID) | ORAL | 0 refills | Status: DC | PRN

## 2017-06-01 MED ORDER — CEPHALEXIN 500 MG PO CAPS: 500.0000 mg | ORAL_CAPSULE | Freq: Three times a day (TID) | ORAL | 0 refills | Status: DC

## 2017-06-01 MED ORDER — IBUPROFEN 600 MG PO TABS: 600.0000 mg | ORAL_TABLET | Freq: Four times a day (QID) | ORAL | 0 refills | Status: AC | PRN

## 2017-06-01 MED ORDER — CEFTRIAXONE SODIUM 1 G IJ SOLR
INTRAMUSCULAR | Status: AC
  Filled 2017-06-01: qty 10

## 2017-06-01 MED ORDER — CEFTRIAXONE SODIUM 1 G IJ SOLR
1.0000 g | Freq: Once | INTRAMUSCULAR | Status: AC
  Administered 2017-06-01: 1 g via INTRAMUSCULAR

## 2017-06-01 NOTE — ED Provider Notes (Signed)
HPI  SUBJECTIVE:  Candice Reynolds is a 46 y.o. female who presents with intermittent left back pain described as soreness and odorous urine starting yesterday. Unsure if she has hematuria because she is on menses. She states that her urine is darker than usual but it tried increasing fluids, Azo, ibuprofen, Tylenol without any improvement in her symptoms. No aggravating factors. She denies nausea, vomiting, fevers, bodyaches, pelvic pain, abdominal pain, flank pain. She denies vaginal odor, abnormal discharge, genital rash, irritation. She is in a long-term monogamous relationship with her boyfriend who is asymptomatic. STDs are not a concern today. No antibiotics in the past month. She took an antipyretic within 6-8 hours of evaluation. She has past medical history of UTIs, pyleonephritis. States that This feels identical to previous pyelonephritis. No history of nephrolithiasis, gonorrhea, chlamydia, herpes, HIV, syphilis, Trichomonas, BV, yeast. No history of diabetes or hypertension. :LMP now. S/p bilateral tubal ligation. ZOX:[id number], L.August Saucer, MD     Past Medical History:  Diagnosis Date  . Bipolar disorder (HCC)    "very low stage" (01/16/2014)  . Migraine    "not as often anymore; take RX qd" (01/16/2014)  . Stroke [facility]) ~ 2006   "light"    Past Surgical History:  Procedure Laterality Date  . BUNIONECTOMY Bilateral   . REFRACTIVE SURGERY Bilateral ~ 2001  . TONSILLECTOMY    . TUBAL LIGATION      Family History  Problem Relation Age of Onset  . CAD Father   . CAD Paternal Grandmother     Social History  Substance Use Topics  . Smoking status: Never Smoker  . Smokeless tobacco: Never Used  . Alcohol use 4.2 oz/week    7 Cans of beer per week     Comment: "@ least 1 beer/day"    No current facility-administered medications for this encounter.   Current Outpatient Prescriptions:  .  ALPRAZolam (XANAX) 0.5 MG tablet, Take 0.5 mg by mouth 4 (four) times daily as needed for  anxiety. , Disp: , Rfl:  .  citalopram (CELEXA) 20 MG tablet, Take 20 mg by mouth daily., Disp: , Rfl:  .  lamoTRIgine (LAMICTAL) 200 MG tablet, Take 200 mg by mouth at bedtime., Disp: , Rfl:  .  omeprazole (PRILOSEC) 40 MG capsule, Take 40 mg by mouth daily., Disp: , Rfl:  .  propranolol (INDERAL) 10 MG tablet, Take 10 mg by mouth 4 (four) times daily as needed (anxiety). , Disp: , Rfl:  .  cephALEXin (KEFLEX) 500 MG capsule, Take 1 capsule (500 mg total) by mouth 3 (three) times daily. X 14 days, Disp: 42 capsule, Rfl: 0 .  ibuprofen (ADVIL,MOTRIN) 600 MG tablet, Take 1 tablet (600 mg total) by mouth every 6 (six) hours as needed., Disp: 30 tablet, Rfl: 0 .  Multiple Vitamins-Minerals (MULTIVITAL PO), Take 1 tablet by mouth daily., Disp: , Rfl:  .  perphenazine (TRILAFON) 2 MG tablet, Take 2 mg by mouth daily., Disp: , Rfl:  .  phenazopyridine (PYRIDIUM) 200 MG tablet, Take 1 tablet (200 mg total) by mouth 3 (three) times daily as needed for pain., Disp: 6 tablet, Rfl: 0  Allergies  Allergen Reactions  . Aspirin     Sick to stomach     ROS  As noted in HPI.   Physical Exam  BP (!) 144/92 (BP Location: Left Arm)   Pulse 67   Temp 98.3 F (36.8 C) (Oral)   Resp 17   SpO2 100%   Constitutional: Well developed, well  nourished, no acute distress Eyes:  EOMI, conjunctiva normal bilaterally HENT: Normocephalic, atraumatic,mucus membranes moist Respiratory: Normal inspiratory effort Cardiovascular: Normal rate GI: nondistended. positive suprapubic tenderness. No flank tenderness. Back: Positive left CVA tenderness skin: No rash, skin intact Musculoskeletal: no deformities Neurologic: Alert & oriented x 3, no focal neuro deficits Psychiatric: Speech and behavior appropriate   ED Course   Medications  cefTRIAXone (ROCEPHIN) injection 1 g (1 g Intramuscular Given 06/01/17 1604)    Orders Placed This Encounter  Procedures  . Urine culture    Standing Status:   Standing     Number of Occurrences:   1    Order Specific Question:   Patient immune status    Answer:   Normal  . POCT urinalysis dip (device)    Standing Status:   Standing    Number of Occurrences:   1    Results for orders placed or performed during the hospital encounter of 06/01/17 (from the past 24 hour(s))  POCT urinalysis dip (device)     Status: Abnormal   Collection Time: 06/01/17  3:49 PM  Result Value Ref Range   Glucose, UA NEGATIVE NEGATIVE mg/dL   Bilirubin Urine NEGATIVE NEGATIVE   Ketones, ur NEGATIVE NEGATIVE mg/dL   Specific Gravity, Urine 1.015 1.005 - 1.030   Hgb urine dipstick LARGE (A) NEGATIVE   pH 7.5 5.0 - 8.0   Protein, ur NEGATIVE NEGATIVE mg/dL   Urobilinogen, UA 0.2 0.0 - 1.0 mg/dL   Nitrite NEGATIVE NEGATIVE   Leukocytes, UA SMALL (A) NEGATIVE   No results found.  ED Clinical Impression  Urinary tract infection with hematuria, site unspecified   ED Assessment/Plan  No previous urine cultures available  Urine dips just that of UTI. Hematuria noted, but patient is on menses. We'll send urine off for culture to confirm antibiotic choice. Giving Rocephin 1 g here for possible pyelonephritis and sending home with 14 days of Keflex since she does have CVA tenderness. Also 3 days of Pyridium, ibuprofen. Follow up with PMD as needed, to the ER if she gets worse.  Discussed labs,  MDM, plan and followup with patient. Discussed sn/sx that should prompt return to the ED. Patient agrees with plan.   Meds ordered this encounter  Medications  . cefTRIAXone (ROCEPHIN) injection 1 g  . phenazopyridine (PYRIDIUM) 200 MG tablet    Sig: Take 1 tablet (200 mg total) by mouth 3 (three) times daily as needed for pain.    Dispense:  6 tablet    Refill:  0  . cephALEXin (KEFLEX) 500 MG capsule    Sig: Take 1 capsule (500 mg total) by mouth 3 (three) times daily. X 14 days    Dispense:  42 capsule    Refill:  0  . ibuprofen (ADVIL,MOTRIN) 600 MG tablet    Sig: Take 1  tablet (600 mg total) by mouth every 6 (six) hours as needed.    Dispense:  30 tablet    Refill:  0    *This clinic note was created using Scientist, clinical (histocompatibility and immunogenetics)Dragon dictation software. Therefore, there may be occasional mistakes despite careful proofreading.  ?    Domenick GongMortenson, Hassani Sliney, MD 06/01/17 [phone]

## 2017-06-01 NOTE — ED Triage Notes (Signed)
Patient reports left sided flank pain and foul smelling urine has been taking otc meds with no relief.

## 2017-06-04 LAB — URINE CULTURE

## 2017-06-15 DIAGNOSIS — D509 Iron deficiency anemia, unspecified: Secondary | ICD-10-CM | POA: Diagnosis not present

## 2017-06-15 DIAGNOSIS — N938 Other specified abnormal uterine and vaginal bleeding: Secondary | ICD-10-CM | POA: Diagnosis not present

## 2017-06-15 DIAGNOSIS — R102 Pelvic and perineal pain: Secondary | ICD-10-CM | POA: Diagnosis not present

## 2017-07-04 DIAGNOSIS — N920 Excessive and frequent menstruation with regular cycle: Secondary | ICD-10-CM | POA: Diagnosis not present

## 2017-07-04 DIAGNOSIS — N946 Dysmenorrhea, unspecified: Secondary | ICD-10-CM | POA: Diagnosis not present

## 2017-07-04 DIAGNOSIS — N924 Excessive bleeding in the premenopausal period: Secondary | ICD-10-CM | POA: Diagnosis not present

## 2017-07-19 DIAGNOSIS — Z30430 Encounter for insertion of intrauterine contraceptive device: Secondary | ICD-10-CM | POA: Diagnosis not present

## 2017-08-09 DIAGNOSIS — Z30431 Encounter for routine checking of intrauterine contraceptive device: Secondary | ICD-10-CM | POA: Diagnosis not present

## 2017-08-09 DIAGNOSIS — R102 Pelvic and perineal pain: Secondary | ICD-10-CM | POA: Diagnosis not present

## 2017-09-06 DIAGNOSIS — Z30431 Encounter for routine checking of intrauterine contraceptive device: Secondary | ICD-10-CM | POA: Diagnosis not present

## 2017-10-12 DIAGNOSIS — F3181 Bipolar II disorder: Secondary | ICD-10-CM | POA: Diagnosis not present

## 2017-10-12 DIAGNOSIS — F411 Generalized anxiety disorder: Secondary | ICD-10-CM | POA: Diagnosis not present

## 2017-12-14 DIAGNOSIS — R109 Unspecified abdominal pain: Secondary | ICD-10-CM | POA: Diagnosis not present

## 2017-12-18 ENCOUNTER — Other Ambulatory Visit: Payer: Self-pay | Admitting: Family Medicine

## 2017-12-18 DIAGNOSIS — R109 Unspecified abdominal pain: Secondary | ICD-10-CM

## 2017-12-19 ENCOUNTER — Ambulatory Visit
Admission: RE | Admit: 2017-12-19 | Discharge: 2017-12-19 | Disposition: A | Discharge status: Home / Self Care | Payer: Federal, State, Local not specified - PPO | Source: Ambulatory Visit | Attending: Family Medicine | Admitting: Family Medicine

## 2017-12-19 DIAGNOSIS — R109 Unspecified abdominal pain: Secondary | ICD-10-CM

## 2017-12-19 DIAGNOSIS — N898 Other specified noninflammatory disorders of vagina: Secondary | ICD-10-CM | POA: Diagnosis not present

## 2017-12-22 DIAGNOSIS — R102 Pelvic and perineal pain: Secondary | ICD-10-CM | POA: Diagnosis not present

## 2017-12-22 DIAGNOSIS — R1031 Right lower quadrant pain: Secondary | ICD-10-CM | POA: Diagnosis not present

## 2017-12-22 DIAGNOSIS — N83201 Unspecified ovarian cyst, right side: Secondary | ICD-10-CM | POA: Diagnosis not present

## 2017-12-22 DIAGNOSIS — N939 Abnormal uterine and vaginal bleeding, unspecified: Secondary | ICD-10-CM | POA: Diagnosis not present

## 2018-01-06 IMAGING — CT CT ABD-PELV W/ CM
2 of 5 series · 16 of 46 positions shown, 18 images · IV contrast (APPLIED)
Comparison: None.

CLINICAL DATA: Epigastric pain.  Sharp stabbing pain with nausea.

EXAM:
CT ABDOMEN AND PELVIS WITH CONTRAST
TECHNIQUE: Multidetector CT imaging of the abdomen and pelvis was performed
using the standard protocol following bolus administration of
intravenous contrast.
CONTRAST:  100mL [id number] IOPAMIDOL ([id number]) INJECTION 61%

[Series 2: abd/ pelvis 5.0 i30f 1 · axial · 0.67mm/px · z∈[-452,-27]mm · 13 of 95 slices shown, 15 images]
[im 5/95  soft-tissue]
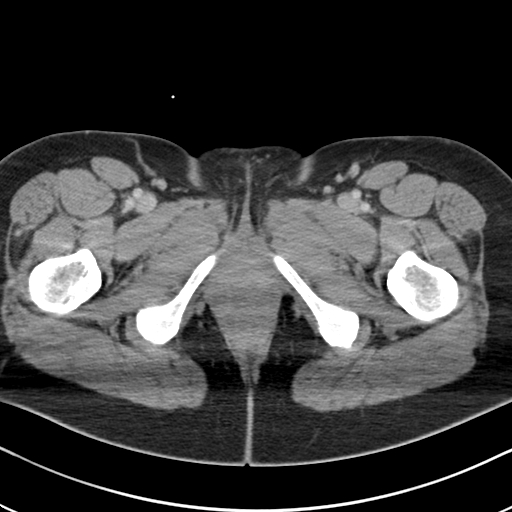
[im 5/95  bone]
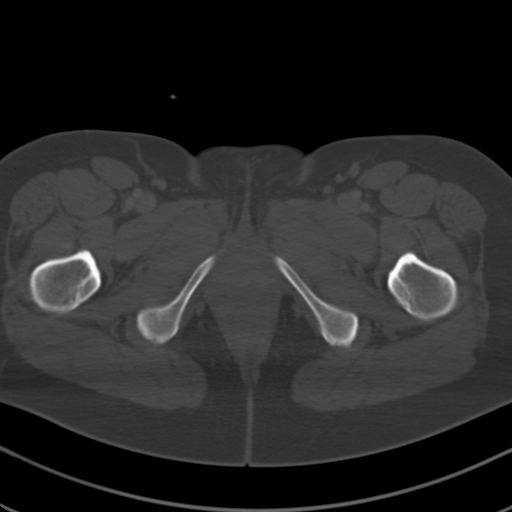
[im 15/95  soft-tissue]
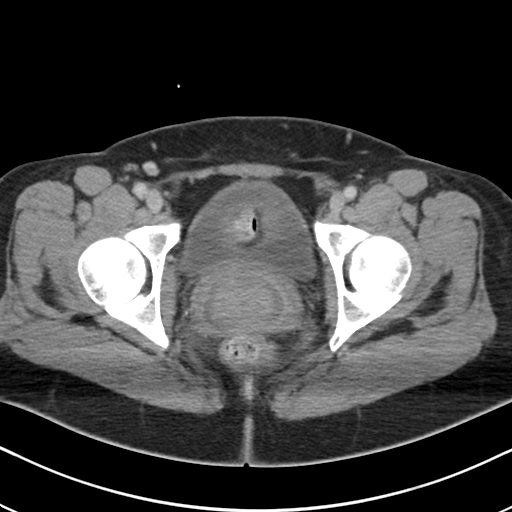
[im 20/95  soft-tissue]
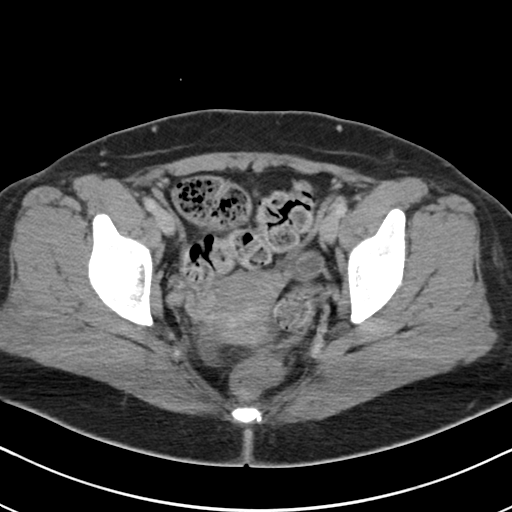
[im 25/95  soft-tissue]
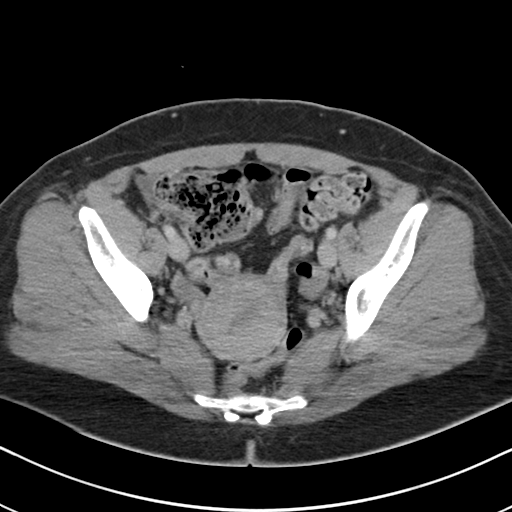
[im 35/95  soft-tissue]
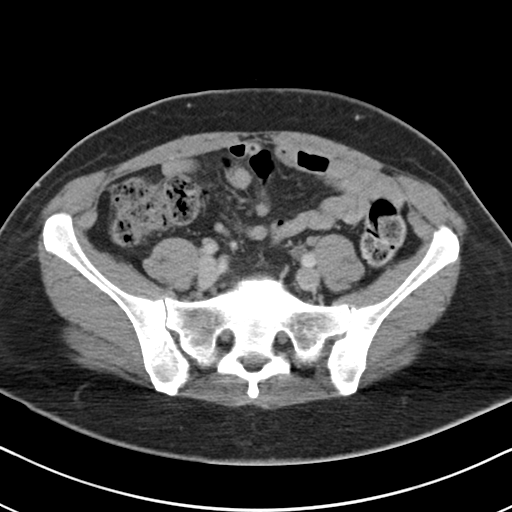
[im 40/95  soft-tissue]
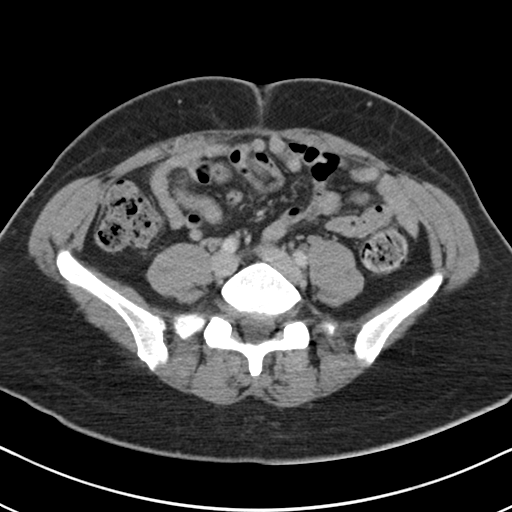
[im 50/95  soft-tissue]
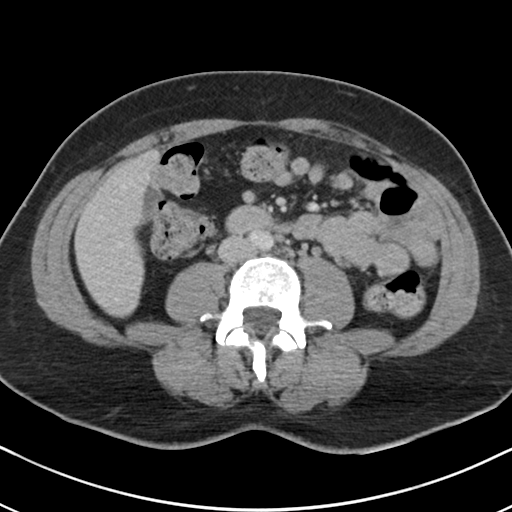
[im 55/95  soft-tissue]
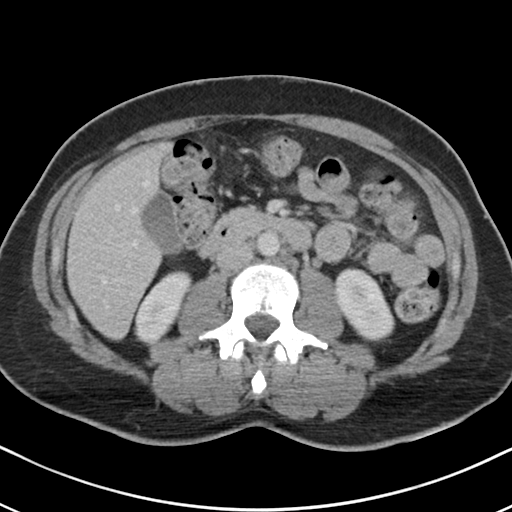
[im 60/95  soft-tissue]
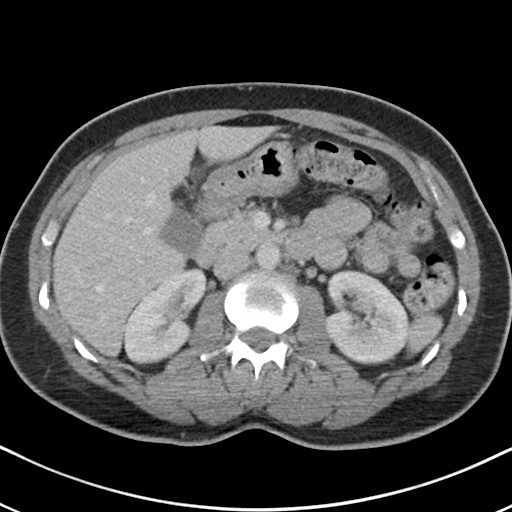
[im 60/95  bone]
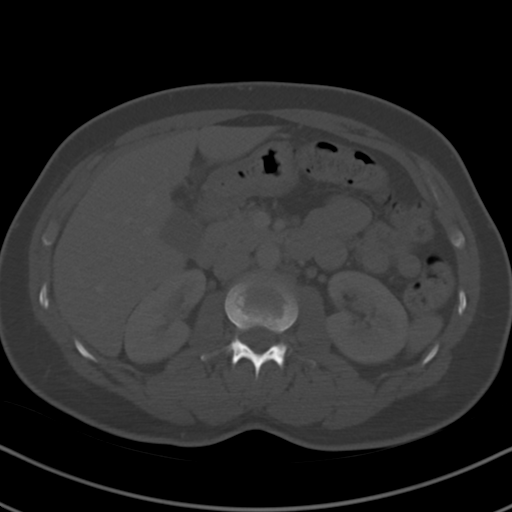
[im 70/95  soft-tissue]
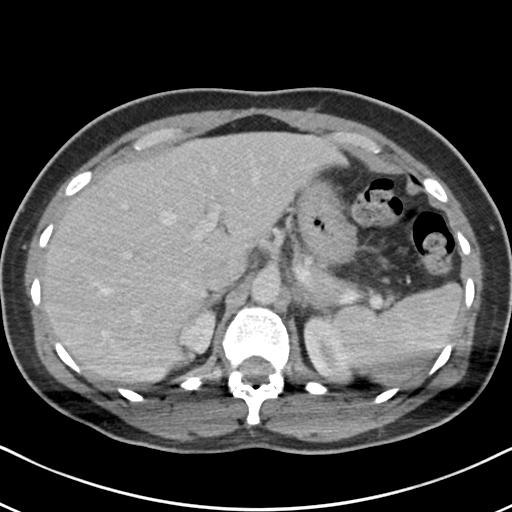
[im 75/95  soft-tissue]
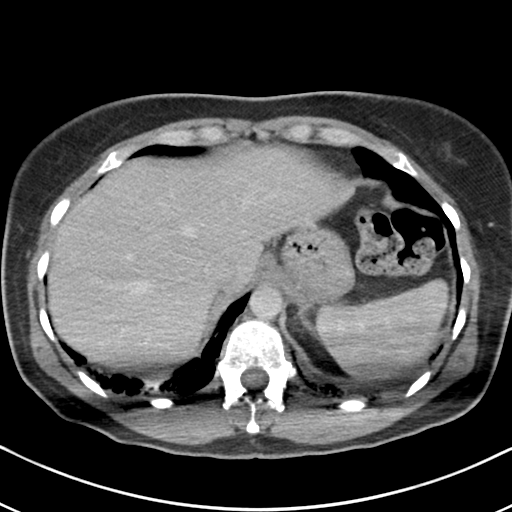
[im 80/95  soft-tissue]
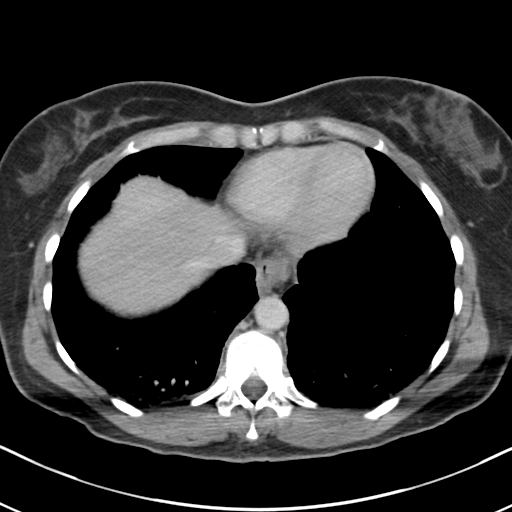
[im 90/95  soft-tissue]
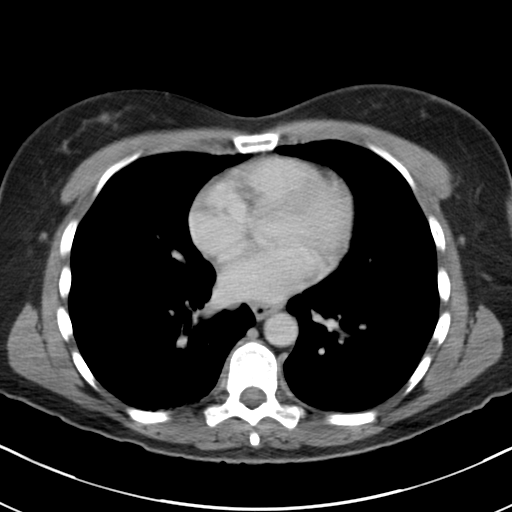

[Series 4: coronal soft tissue · coronal · 0.68mm/px · 3 of 83 slices shown]
[im 28/83  soft-tissue]
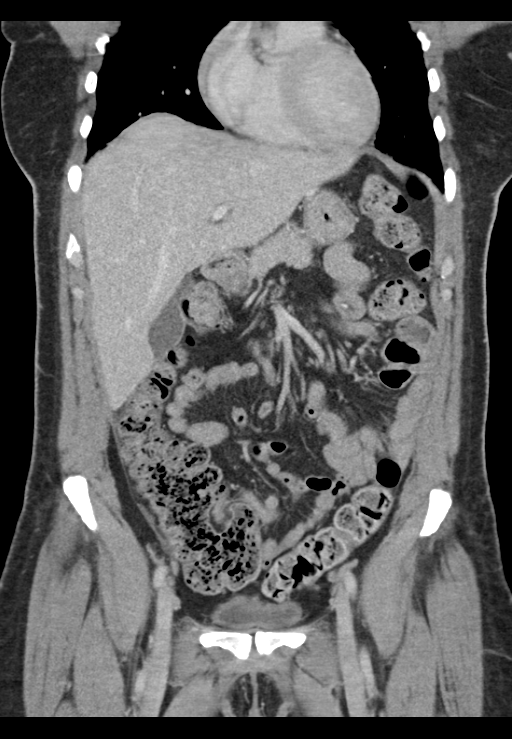
[im 37/83  soft-tissue]
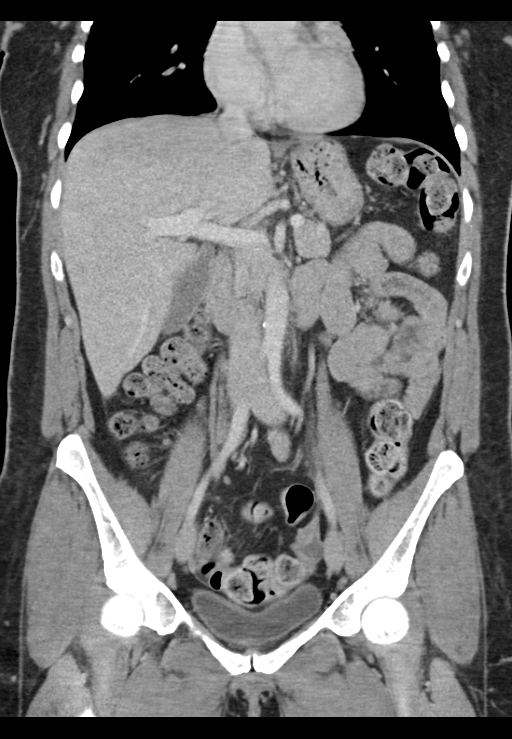
[im 46/83  soft-tissue]
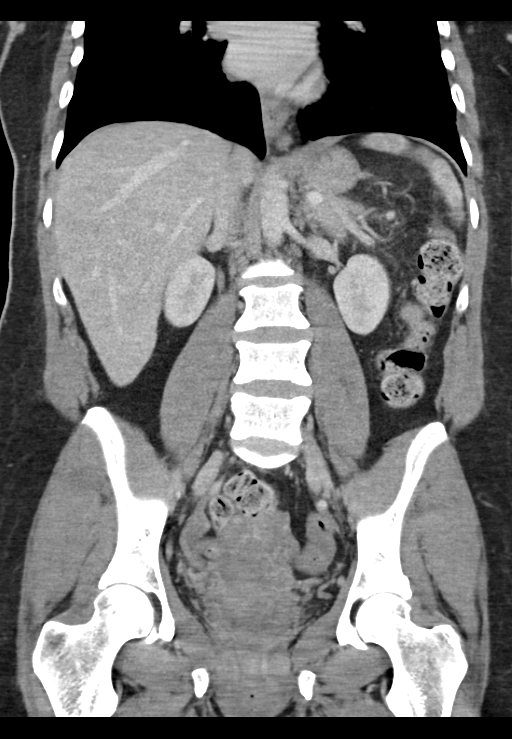

[16 of 46 positions shown; findings below may reference images not displayed]

FINDINGS: Lower chest:  Bibasilar atelectasis.  Normal heart size.

Hepatobiliary: Normal liver.  Normal gallbladder.

Pancreas: Normal.

Spleen: Normal.

Adrenals/Urinary Tract: Normal adrenal glands. Punctate
nonobstructing right renal calculus. No obstructive uropathy. Mild
renal cortical scarring of the upper pole of the right kidney.

Stomach/Bowel: No bowel wall thickening or dilatation. No
pneumatosis, pneumoperitoneum or portal venous gas. Moderate amount
of stool throughout the colon. No abdominal or pelvic free fluid.

Vascular/Lymphatic: Normal caliber abdominal aorta. No
lymphadenopathy.

Reproductive: Normal uterus.  No adnexal mass.

Other: No fluid collection or hematoma.

Musculoskeletal: No acute osseous abnormality. No lytic or sclerotic
osseous lesion. Mild degenerative changes of bilateral SI joints.
IMPRESSION: No acute abdominal or pelvic pathology.

## 2018-01-29 DIAGNOSIS — H6091 Unspecified otitis externa, right ear: Secondary | ICD-10-CM | POA: Diagnosis not present

## 2018-01-29 DIAGNOSIS — J040 Acute laryngitis: Secondary | ICD-10-CM | POA: Diagnosis not present

## 2018-01-31 DIAGNOSIS — J029 Acute pharyngitis, unspecified: Secondary | ICD-10-CM | POA: Diagnosis not present

## 2018-01-31 DIAGNOSIS — Z77098 Contact with and (suspected) exposure to other hazardous, chiefly nonmedicinal, chemicals: Secondary | ICD-10-CM | POA: Diagnosis not present

## 2018-03-06 DIAGNOSIS — M7710 Lateral epicondylitis, unspecified elbow: Secondary | ICD-10-CM | POA: Diagnosis not present

## 2018-03-22 DIAGNOSIS — R350 Frequency of micturition: Secondary | ICD-10-CM | POA: Diagnosis not present

## 2018-03-22 DIAGNOSIS — N920 Excessive and frequent menstruation with regular cycle: Secondary | ICD-10-CM | POA: Diagnosis not present

## 2018-03-22 DIAGNOSIS — N3281 Overactive bladder: Secondary | ICD-10-CM | POA: Diagnosis not present

## 2018-03-22 DIAGNOSIS — R102 Pelvic and perineal pain: Secondary | ICD-10-CM | POA: Diagnosis not present

## 2018-04-26 DIAGNOSIS — M549 Dorsalgia, unspecified: Secondary | ICD-10-CM | POA: Diagnosis not present

## 2018-05-08 DIAGNOSIS — F411 Generalized anxiety disorder: Secondary | ICD-10-CM | POA: Diagnosis not present

## 2018-05-08 DIAGNOSIS — F3181 Bipolar II disorder: Secondary | ICD-10-CM | POA: Diagnosis not present

## 2018-05-21 DIAGNOSIS — M7918 Myalgia, other site: Secondary | ICD-10-CM | POA: Diagnosis not present

## 2018-05-21 DIAGNOSIS — M25521 Pain in right elbow: Secondary | ICD-10-CM | POA: Diagnosis not present

## 2018-05-21 DIAGNOSIS — M5412 Radiculopathy, cervical region: Secondary | ICD-10-CM | POA: Diagnosis not present

## 2018-05-22 DIAGNOSIS — M9903 Segmental and somatic dysfunction of lumbar region: Secondary | ICD-10-CM | POA: Diagnosis not present

## 2018-05-22 DIAGNOSIS — M9905 Segmental and somatic dysfunction of pelvic region: Secondary | ICD-10-CM | POA: Diagnosis not present

## 2018-05-22 DIAGNOSIS — M9902 Segmental and somatic dysfunction of thoracic region: Secondary | ICD-10-CM | POA: Diagnosis not present

## 2018-05-22 DIAGNOSIS — M545 Low back pain: Secondary | ICD-10-CM | POA: Diagnosis not present

## 2018-05-23 DIAGNOSIS — M9903 Segmental and somatic dysfunction of lumbar region: Secondary | ICD-10-CM | POA: Diagnosis not present

## 2018-05-23 DIAGNOSIS — M545 Low back pain: Secondary | ICD-10-CM | POA: Diagnosis not present

## 2018-05-23 DIAGNOSIS — M9902 Segmental and somatic dysfunction of thoracic region: Secondary | ICD-10-CM | POA: Diagnosis not present

## 2018-05-23 DIAGNOSIS — M9905 Segmental and somatic dysfunction of pelvic region: Secondary | ICD-10-CM | POA: Diagnosis not present

## 2018-05-25 DIAGNOSIS — M9902 Segmental and somatic dysfunction of thoracic region: Secondary | ICD-10-CM | POA: Diagnosis not present

## 2018-05-25 DIAGNOSIS — M9905 Segmental and somatic dysfunction of pelvic region: Secondary | ICD-10-CM | POA: Diagnosis not present

## 2018-05-25 DIAGNOSIS — M9903 Segmental and somatic dysfunction of lumbar region: Secondary | ICD-10-CM | POA: Diagnosis not present

## 2018-05-25 DIAGNOSIS — M545 Low back pain: Secondary | ICD-10-CM | POA: Diagnosis not present

## 2018-05-29 DIAGNOSIS — M9905 Segmental and somatic dysfunction of pelvic region: Secondary | ICD-10-CM | POA: Diagnosis not present

## 2018-05-29 DIAGNOSIS — M545 Low back pain: Secondary | ICD-10-CM | POA: Diagnosis not present

## 2018-05-29 DIAGNOSIS — M9902 Segmental and somatic dysfunction of thoracic region: Secondary | ICD-10-CM | POA: Diagnosis not present

## 2018-05-29 DIAGNOSIS — M9903 Segmental and somatic dysfunction of lumbar region: Secondary | ICD-10-CM | POA: Diagnosis not present

## 2018-06-25 DIAGNOSIS — M7711 Lateral epicondylitis, right elbow: Secondary | ICD-10-CM | POA: Diagnosis not present

## 2018-07-03 DIAGNOSIS — M25521 Pain in right elbow: Secondary | ICD-10-CM | POA: Diagnosis not present

## 2018-07-03 DIAGNOSIS — M7711 Lateral epicondylitis, right elbow: Secondary | ICD-10-CM | POA: Diagnosis not present

## 2018-07-03 DIAGNOSIS — M6281 Muscle weakness (generalized): Secondary | ICD-10-CM | POA: Diagnosis not present

## 2018-07-05 DIAGNOSIS — M25521 Pain in right elbow: Secondary | ICD-10-CM | POA: Diagnosis not present

## 2018-07-05 DIAGNOSIS — M7711 Lateral epicondylitis, right elbow: Secondary | ICD-10-CM | POA: Diagnosis not present

## 2018-07-05 DIAGNOSIS — M6281 Muscle weakness (generalized): Secondary | ICD-10-CM | POA: Diagnosis not present

## 2018-07-11 DIAGNOSIS — M7711 Lateral epicondylitis, right elbow: Secondary | ICD-10-CM | POA: Diagnosis not present

## 2018-07-30 DIAGNOSIS — M25521 Pain in right elbow: Secondary | ICD-10-CM | POA: Diagnosis not present

## 2018-08-03 DIAGNOSIS — M7711 Lateral epicondylitis, right elbow: Secondary | ICD-10-CM | POA: Diagnosis not present

## 2018-08-20 DIAGNOSIS — M7711 Lateral epicondylitis, right elbow: Secondary | ICD-10-CM | POA: Diagnosis not present

## 2018-09-15 DIAGNOSIS — L03211 Cellulitis of face: Secondary | ICD-10-CM | POA: Diagnosis not present

## 2018-09-17 DIAGNOSIS — H02831 Dermatochalasis of right upper eyelid: Secondary | ICD-10-CM | POA: Diagnosis not present

## 2018-09-17 DIAGNOSIS — B029 Zoster without complications: Secondary | ICD-10-CM | POA: Diagnosis not present

## 2018-09-17 DIAGNOSIS — Z9889 Other specified postprocedural states: Secondary | ICD-10-CM | POA: Diagnosis not present

## 2018-09-17 DIAGNOSIS — B0239 Other herpes zoster eye disease: Secondary | ICD-10-CM | POA: Diagnosis not present

## 2018-09-17 DIAGNOSIS — H40023 Open angle with borderline findings, high risk, bilateral: Secondary | ICD-10-CM | POA: Diagnosis not present

## 2018-09-21 DIAGNOSIS — K080 Exfoliation of teeth due to systemic causes: Secondary | ICD-10-CM | POA: Diagnosis not present

## 2018-10-25 DIAGNOSIS — B0239 Other herpes zoster eye disease: Secondary | ICD-10-CM | POA: Diagnosis not present

## 2018-10-25 DIAGNOSIS — H40023 Open angle with borderline findings, high risk, bilateral: Secondary | ICD-10-CM | POA: Diagnosis not present

## 2018-11-21 DIAGNOSIS — F411 Generalized anxiety disorder: Secondary | ICD-10-CM | POA: Diagnosis not present

## 2018-11-21 DIAGNOSIS — F3181 Bipolar II disorder: Secondary | ICD-10-CM | POA: Diagnosis not present

## 2018-12-15 ENCOUNTER — Ambulatory Visit (HOSPITAL_COMMUNITY)
Admission: EM | Admit: 2018-12-15 | Discharge: 2018-12-15 | Disposition: A | Discharge status: Home / Self Care | Payer: Federal, State, Local not specified - PPO | Attending: Emergency Medicine | Admitting: Emergency Medicine

## 2018-12-15 ENCOUNTER — Other Ambulatory Visit: Payer: Self-pay

## 2018-12-15 ENCOUNTER — Encounter (HOSPITAL_COMMUNITY): Payer: Self-pay | Admitting: Emergency Medicine

## 2018-12-15 DIAGNOSIS — N12 Tubulo-interstitial nephritis, not specified as acute or chronic: Secondary | ICD-10-CM

## 2018-12-15 LAB — POCT URINALYSIS DIP (DEVICE)
Bilirubin Urine: NEGATIVE
Glucose, UA: NEGATIVE mg/dL
Ketones, ur: NEGATIVE mg/dL
Nitrite: NEGATIVE
Protein, ur: NEGATIVE mg/dL
Specific Gravity, Urine: 1.015 (ref 1.005–1.030)
Urobilinogen, UA: 0.2 mg/dL (ref 0.0–1.0)
pH: 7 (ref 5.0–8.0)

## 2018-12-15 MED ORDER — KETOROLAC TROMETHAMINE 60 MG/2ML IM SOLN
INTRAMUSCULAR | Status: AC
  Filled 2018-12-15: qty 2

## 2018-12-15 MED ORDER — CEPHALEXIN 500 MG PO CAPS: 500.0000 mg | ORAL_CAPSULE | Freq: Two times a day (BID) | ORAL | 0 refills | Status: AC

## 2018-12-15 MED ORDER — KETOROLAC TROMETHAMINE 60 MG/2ML IM SOLN
60.0000 mg | Freq: Once | INTRAMUSCULAR | Status: AC
  Administered 2018-12-15: 11:00:00 60 mg via INTRAMUSCULAR

## 2018-12-15 NOTE — Discharge Instructions (Signed)
This appears consistent with kidney infection.  However, kidney stone may still be considered.  If no improvement or if worsening of symptoms or pain after approximately 48 hours of antibiotics I would recommend going to the ER, or if able following up with your PCP as kidney stone rule out with imaging may be warranted.  Ibuprofen for pain, do not take for another 8 hours.  Drink plenty of water to empty bladder regularly. Avoid alcohol and caffeine as these may irritate the bladder.   If develop fevers, chills, nausea vomiting or otherwise worsening please go to the ER.

## 2018-12-15 NOTE — ED Provider Notes (Signed)
[facility]    CSN: [id number] Arrival date & time: 12/15/18  1006     History   Chief Complaint Chief Complaint  Patient presents with  . Back Pain    HPI Candice Reynolds is a 48 y.o. female.   Candice Reynolds presents with complaints of right back pain which is worse prior to urination, started two weeks ago. Has had urinary frequency. Worsened over the past few days. Urinary urgency as well. Nausea, no vomiting. 10/10 when it is severe. Ibuprofen does help some, hasn't taken today. Has had some vaginal spotting recently, has an IUD. No known blood in urine. Pain worsens if she experiences any "jarring" activity, but otherwise light activity does not increase. Has had the same in the past related to pyelonephritis. Denies any specific known history of kidney stone. No change to bowel habits. Has had increased stress recently. Hx of bipolar, migraine, stroke, htn.    ROS per HPI, negative if not otherwise mentioned.      Past Medical History:  Diagnosis Date  . Bipolar disorder (HCC)    "very low stage" (01/16/2014)  . Migraine    "not as often anymore; take RX qd" (01/16/2014)  . Stroke [facility](HCC) ~ 2006   "light"    Patient Active Problem List   Diagnosis Date Noted  . HLD (hyperlipidemia) 01/17/2014  . Chest pain at rest 01/16/2014  . Hypertension 01/16/2014    Past Surgical History:  Procedure Laterality Date  . BUNIONECTOMY Bilateral   . REFRACTIVE SURGERY Bilateral ~ 2001  . TONSILLECTOMY    . TUBAL LIGATION      OB History   No obstetric history on file.      Home Medications    Prior to Admission medications   Medication Sig Start Date End Date Taking? Authorizing Provider  ALPRAZolam Prudy Feeler(XANAX) 0.5 MG tablet Take 0.5 mg by mouth 4 (four) times daily as needed for anxiety.  11/26/13   [provider]  cephALEXin (KEFLEX) 500 MG capsule Take 1 capsule (500 mg total) by mouth 2 (two) times daily for 14 days. 12/15/18 12/29/18  Georgetta HaberBurky, Natalie  B, NP  citalopram (CELEXA) 20 MG tablet Take 20 mg by mouth daily. 12/26/13   [provider]  ibuprofen (ADVIL,MOTRIN) 600 MG tablet Take 1 tablet (600 mg total) by mouth every 6 (six) hours as needed. 06/01/17   Domenick GongMortenson, Ashley, MD  lamoTRIgine (LAMICTAL) 200 MG tablet Take 200 mg by mouth at bedtime.    [provider]  Multiple Vitamins-Minerals (MULTIVITAL PO) Take 1 tablet by mouth daily.    [provider]  omeprazole (PRILOSEC) 40 MG capsule Take 40 mg by mouth daily. 01/14/16   [provider]  perphenazine (TRILAFON) 2 MG tablet Take 2 mg by mouth daily. 12/26/13   [provider]  propranolol (INDERAL) 10 MG tablet Take 10 mg by mouth 4 (four) times daily as needed (anxiety).  01/03/14   [provider]    Family History Family History  Problem Relation Age of Onset  . CAD Father   . CAD Paternal Grandmother     Social History Social History   Tobacco Use  . Smoking status: Never Smoker  . Smokeless tobacco: Never Used  Substance Use Topics  . Alcohol use: Yes    Alcohol/week: 7.0 standard drinks    Types: 7 Cans of beer per week    Comment: "@ least 1 beer/day"  . Drug use: No     Allergies  Aspirin   Review of Systems Review of Systems   Physical Exam Triage Vital Signs ED Triage Vitals  Enc Vitals Group     BP 12/15/18 1024 (!) 140/94     Pulse Rate 12/15/18 1024 74     Resp 12/15/18 1024 16     Temp 12/15/18 1024 98 F (36.7 C)     Temp Source 12/15/18 1024 Oral     SpO2 12/15/18 1024 100 %     Weight --      Height --      Head Circumference --      Peak Flow --      Pain Score 12/15/18 1025 10     Pain Loc --      Pain Edu? --      Excl. in GC? --    No data found.  Updated Vital Signs BP (!) 140/94 (BP Location: Right Arm)   Pulse 74   Temp 98 F (36.7 C) (Oral)   Resp 16   SpO2 100%    Physical Exam Constitutional:      General: She is not in acute distress.    Appearance:  She is well-developed.  Cardiovascular:     Rate and Rhythm: Normal rate and regular rhythm.     Heart sounds: Normal heart sounds.  Pulmonary:     Effort: Pulmonary effort is normal.     Breath sounds: Normal breath sounds.  Abdominal:     Tenderness: There is right CVA tenderness.  Skin:    General: Skin is warm and dry.  Neurological:     Mental Status: She is alert and oriented to person, place, and time.      UC Treatments / Results  Labs (all labs ordered are listed, but only abnormal results are displayed) Labs Reviewed  POCT URINALYSIS DIP (DEVICE) - Abnormal; Notable for the following components:      Result Value   Hgb urine dipstick MODERATE (*)    Leukocytes,Ua TRACE (*)    All other components within normal limits  URINE CULTURE    EKG None  Radiology No results found.  Procedures Procedures (including critical care time)  Medications Ordered in UC Medications  ketorolac (TORADOL) injection 60 mg (60 mg Intramuscular Given 12/15/18 1102)    Initial Impression / Assessment and Plan / UC Course  I have reviewed the triage vital signs and the nursing notes.  Pertinent labs & imaging results that were available during my care of the patient were reviewed by me and considered in my medical decision making (see chart for details).     CVA tenderness, urinary symptoms, hgb and leuks to urine. Will treat as pyelo at this time, currently afebrile without gi symptoms. Return precautions provided. Patient verbalized understanding and agreeable to plan.    Final Clinical Impressions(s) / UC Diagnoses   Final diagnoses:  Pyelonephritis     Discharge Instructions     This appears consistent with kidney infection.  However, kidney stone may still be considered.  If no improvement or if worsening of symptoms or pain after approximately 48 hours of antibiotics I would recommend going to the ER, or if able following up with your PCP as kidney stone rule out  with imaging may be warranted.  Ibuprofen for pain, do not take for another 8 hours.  Drink plenty of water to empty bladder regularly. Avoid alcohol and caffeine as these may irritate the bladder.   If develop fevers, chills, nausea vomiting  or otherwise worsening please go to the ER.    ED Prescriptions    Medication Sig Dispense Auth. Provider   cephALEXin (KEFLEX) 500 MG capsule Take 1 capsule (500 mg total) by mouth 2 (two) times daily for 14 days. 28 capsule Georgetta Haber, NP     Controlled Substance Prescriptions [place] Controlled Substance Registry consulted? Not Applicable   Georgetta Haber, NP 12/15/18 1402

## 2018-12-15 NOTE — ED Triage Notes (Signed)
Per pt she has been having lower back pain on the right side of her back. She has been noticing really high yellow urine. No pain with urination. Pt stated when she get up in the morning to urinate she feels like her back is really hurting after urination.

## 2018-12-16 LAB — URINE CULTURE: Culture: NO GROWTH

## 2018-12-20 ENCOUNTER — Telehealth (HOSPITAL_COMMUNITY): Payer: Self-pay | Admitting: Emergency Medicine

## 2018-12-20 NOTE — Telephone Encounter (Signed)
Patient called and left voicemail stating she felt no better, asking about urine culture. Call returned, patient given results. Patient states she still is having a lot of back pain. Patient encouraged to either return for follow up or contact her PCP for continued symptoms. Patient agreeable to plan.

## 2019-01-22 DIAGNOSIS — H109 Unspecified conjunctivitis: Secondary | ICD-10-CM | POA: Diagnosis not present

## 2019-01-22 DIAGNOSIS — R251 Tremor, unspecified: Secondary | ICD-10-CM | POA: Diagnosis not present

## 2019-01-22 DIAGNOSIS — L03019 Cellulitis of unspecified finger: Secondary | ICD-10-CM | POA: Diagnosis not present

## 2019-03-01 DIAGNOSIS — F3181 Bipolar II disorder: Secondary | ICD-10-CM | POA: Diagnosis not present

## 2019-03-01 DIAGNOSIS — F411 Generalized anxiety disorder: Secondary | ICD-10-CM | POA: Diagnosis not present

## 2019-04-15 ENCOUNTER — Other Ambulatory Visit: Payer: Self-pay | Admitting: Family Medicine

## 2019-04-15 ENCOUNTER — Other Ambulatory Visit (HOSPITAL_COMMUNITY): Payer: Self-pay | Admitting: Family Medicine

## 2019-04-15 ENCOUNTER — Other Ambulatory Visit (HOSPITAL_BASED_OUTPATIENT_CLINIC_OR_DEPARTMENT_OTHER): Payer: Self-pay

## 2019-04-15 DIAGNOSIS — R109 Unspecified abdominal pain: Secondary | ICD-10-CM

## 2019-04-16 DIAGNOSIS — N3000 Acute cystitis without hematuria: Secondary | ICD-10-CM | POA: Diagnosis not present

## 2019-04-16 DIAGNOSIS — R109 Unspecified abdominal pain: Secondary | ICD-10-CM | POA: Diagnosis not present

## 2019-05-08 DIAGNOSIS — B029 Zoster without complications: Secondary | ICD-10-CM | POA: Diagnosis not present

## 2019-05-29 DIAGNOSIS — F3181 Bipolar II disorder: Secondary | ICD-10-CM | POA: Diagnosis not present

## 2019-05-29 DIAGNOSIS — F411 Generalized anxiety disorder: Secondary | ICD-10-CM | POA: Diagnosis not present

## 2019-07-03 DIAGNOSIS — U071 COVID-19: Secondary | ICD-10-CM | POA: Diagnosis not present

## 2019-07-03 DIAGNOSIS — J029 Acute pharyngitis, unspecified: Secondary | ICD-10-CM | POA: Diagnosis not present

## 2019-07-03 DIAGNOSIS — R52 Pain, unspecified: Secondary | ICD-10-CM | POA: Diagnosis not present

## 2019-09-13 DIAGNOSIS — M25572 Pain in left ankle and joints of left foot: Secondary | ICD-10-CM | POA: Diagnosis not present

## 2019-09-19 DIAGNOSIS — F3181 Bipolar II disorder: Secondary | ICD-10-CM | POA: Diagnosis not present

## 2019-09-19 DIAGNOSIS — F411 Generalized anxiety disorder: Secondary | ICD-10-CM | POA: Diagnosis not present

## 2019-11-18 ENCOUNTER — Other Ambulatory Visit (HOSPITAL_COMMUNITY)
Admission: RE | Admit: 2019-11-18 | Discharge: 2019-11-18 | Disposition: A | Discharge status: Home / Self Care | Payer: Federal, State, Local not specified - PPO | Source: Ambulatory Visit | Attending: Family Medicine | Admitting: Family Medicine

## 2019-11-18 ENCOUNTER — Other Ambulatory Visit: Payer: Self-pay | Admitting: Family Medicine

## 2019-11-18 DIAGNOSIS — E7800 Pure hypercholesterolemia, unspecified: Secondary | ICD-10-CM | POA: Diagnosis not present

## 2019-11-18 DIAGNOSIS — Z124 Encounter for screening for malignant neoplasm of cervix: Secondary | ICD-10-CM | POA: Diagnosis not present

## 2019-11-18 DIAGNOSIS — Z0000 Encounter for general adult medical examination without abnormal findings: Secondary | ICD-10-CM | POA: Diagnosis not present

## 2019-11-20 LAB — CYTOLOGY - PAP: Diagnosis: NEGATIVE

## 2019-11-28 DIAGNOSIS — H1045 Other chronic allergic conjunctivitis: Secondary | ICD-10-CM | POA: Diagnosis not present

## 2019-11-28 DIAGNOSIS — H04123 Dry eye syndrome of bilateral lacrimal glands: Secondary | ICD-10-CM | POA: Diagnosis not present

## 2019-11-28 DIAGNOSIS — H2513 Age-related nuclear cataract, bilateral: Secondary | ICD-10-CM | POA: Diagnosis not present

## 2019-11-28 DIAGNOSIS — H40023 Open angle with borderline findings, high risk, bilateral: Secondary | ICD-10-CM | POA: Diagnosis not present

## 2019-12-10 DIAGNOSIS — F411 Generalized anxiety disorder: Secondary | ICD-10-CM | POA: Diagnosis not present

## 2019-12-10 DIAGNOSIS — F3181 Bipolar II disorder: Secondary | ICD-10-CM | POA: Diagnosis not present

## 2019-12-10 DIAGNOSIS — Z79899 Other long term (current) drug therapy: Secondary | ICD-10-CM | POA: Diagnosis not present

## 2020-02-27 DIAGNOSIS — Z01818 Encounter for other preprocedural examination: Secondary | ICD-10-CM | POA: Diagnosis not present

## 2020-02-27 DIAGNOSIS — R1319 Other dysphagia: Secondary | ICD-10-CM | POA: Diagnosis not present

## 2020-03-06 DIAGNOSIS — N760 Acute vaginitis: Secondary | ICD-10-CM | POA: Diagnosis not present

## 2020-03-30 DIAGNOSIS — N959 Unspecified menopausal and perimenopausal disorder: Secondary | ICD-10-CM | POA: Diagnosis not present

## 2020-03-30 DIAGNOSIS — N760 Acute vaginitis: Secondary | ICD-10-CM | POA: Diagnosis not present

## 2020-03-30 DIAGNOSIS — Z113 Encounter for screening for infections with a predominantly sexual mode of transmission: Secondary | ICD-10-CM | POA: Diagnosis not present

## 2020-04-21 DIAGNOSIS — R1310 Dysphagia, unspecified: Secondary | ICD-10-CM | POA: Diagnosis not present

## 2020-04-21 DIAGNOSIS — Z1211 Encounter for screening for malignant neoplasm of colon: Secondary | ICD-10-CM | POA: Diagnosis not present

## 2020-04-21 DIAGNOSIS — K2980 Duodenitis without bleeding: Secondary | ICD-10-CM | POA: Diagnosis not present

## 2020-04-21 DIAGNOSIS — K228 Other specified diseases of esophagus: Secondary | ICD-10-CM | POA: Diagnosis not present

## 2020-04-21 DIAGNOSIS — K648 Other hemorrhoids: Secondary | ICD-10-CM | POA: Diagnosis not present

## 2020-04-21 DIAGNOSIS — K449 Diaphragmatic hernia without obstruction or gangrene: Secondary | ICD-10-CM | POA: Diagnosis not present

## 2020-04-21 DIAGNOSIS — K621 Rectal polyp: Secondary | ICD-10-CM | POA: Diagnosis not present

## 2020-04-21 DIAGNOSIS — K2930 Chronic superficial gastritis without bleeding: Secondary | ICD-10-CM | POA: Diagnosis not present

## 2020-04-21 DIAGNOSIS — K635 Polyp of colon: Secondary | ICD-10-CM | POA: Diagnosis not present

## 2020-05-29 DIAGNOSIS — F411 Generalized anxiety disorder: Secondary | ICD-10-CM | POA: Diagnosis not present

## 2020-05-29 DIAGNOSIS — F3181 Bipolar II disorder: Secondary | ICD-10-CM | POA: Diagnosis not present

## 2020-07-28 DIAGNOSIS — N760 Acute vaginitis: Secondary | ICD-10-CM | POA: Diagnosis not present

## 2020-07-28 DIAGNOSIS — R6882 Decreased libido: Secondary | ICD-10-CM | POA: Diagnosis not present

## 2020-07-28 DIAGNOSIS — Z113 Encounter for screening for infections with a predominantly sexual mode of transmission: Secondary | ICD-10-CM | POA: Diagnosis not present

## 2020-07-28 DIAGNOSIS — F5231 Female orgasmic disorder: Secondary | ICD-10-CM | POA: Diagnosis not present

## 2020-07-28 DIAGNOSIS — N941 Unspecified dyspareunia: Secondary | ICD-10-CM | POA: Diagnosis not present

## 2020-07-30 DIAGNOSIS — F3181 Bipolar II disorder: Secondary | ICD-10-CM | POA: Diagnosis not present

## 2020-07-30 DIAGNOSIS — F411 Generalized anxiety disorder: Secondary | ICD-10-CM | POA: Diagnosis not present

## 2020-08-12 DIAGNOSIS — R110 Nausea: Secondary | ICD-10-CM | POA: Diagnosis not present

## 2020-08-12 DIAGNOSIS — B349 Viral infection, unspecified: Secondary | ICD-10-CM | POA: Diagnosis not present

## 2020-08-12 DIAGNOSIS — Z20828 Contact with and (suspected) exposure to other viral communicable diseases: Secondary | ICD-10-CM | POA: Diagnosis not present

## 2020-08-24 DIAGNOSIS — F3181 Bipolar II disorder: Secondary | ICD-10-CM | POA: Diagnosis not present

## 2020-08-24 DIAGNOSIS — F411 Generalized anxiety disorder: Secondary | ICD-10-CM | POA: Diagnosis not present

## 2020-10-19 DIAGNOSIS — F411 Generalized anxiety disorder: Secondary | ICD-10-CM | POA: Diagnosis not present

## 2020-10-19 DIAGNOSIS — F3181 Bipolar II disorder: Secondary | ICD-10-CM | POA: Diagnosis not present

## 2020-11-19 DIAGNOSIS — E7800 Pure hypercholesterolemia, unspecified: Secondary | ICD-10-CM | POA: Diagnosis not present

## 2020-11-19 DIAGNOSIS — Z0000 Encounter for general adult medical examination without abnormal findings: Secondary | ICD-10-CM | POA: Diagnosis not present

## 2020-12-04 DIAGNOSIS — Z01818 Encounter for other preprocedural examination: Secondary | ICD-10-CM | POA: Diagnosis not present

## 2021-01-14 DIAGNOSIS — F411 Generalized anxiety disorder: Secondary | ICD-10-CM | POA: Diagnosis not present

## 2021-01-14 DIAGNOSIS — F3181 Bipolar II disorder: Secondary | ICD-10-CM | POA: Diagnosis not present

## 2021-02-02 ENCOUNTER — Emergency Department (HOSPITAL_BASED_OUTPATIENT_CLINIC_OR_DEPARTMENT_OTHER): Payer: Federal, State, Local not specified - PPO

## 2021-02-02 ENCOUNTER — Other Ambulatory Visit: Payer: Self-pay

## 2021-02-02 ENCOUNTER — Emergency Department (HOSPITAL_BASED_OUTPATIENT_CLINIC_OR_DEPARTMENT_OTHER)
Admission: EM | Admit: 2021-02-02 | Discharge: 2021-02-02 | Disposition: A | Discharge status: Home / Self Care | Payer: Federal, State, Local not specified - PPO | Attending: Emergency Medicine | Admitting: Emergency Medicine

## 2021-02-02 ENCOUNTER — Encounter (HOSPITAL_BASED_OUTPATIENT_CLINIC_OR_DEPARTMENT_OTHER): Payer: Self-pay | Admitting: Emergency Medicine

## 2021-02-02 DIAGNOSIS — I10 Essential (primary) hypertension: Secondary | ICD-10-CM | POA: Diagnosis not present

## 2021-02-02 DIAGNOSIS — Z79899 Other long term (current) drug therapy: Secondary | ICD-10-CM | POA: Insufficient documentation

## 2021-02-02 DIAGNOSIS — Y92410 Unspecified street and highway as the place of occurrence of the external cause: Secondary | ICD-10-CM | POA: Insufficient documentation

## 2021-02-02 DIAGNOSIS — K0889 Other specified disorders of teeth and supporting structures: Secondary | ICD-10-CM | POA: Diagnosis not present

## 2021-02-02 DIAGNOSIS — N644 Mastodynia: Secondary | ICD-10-CM | POA: Insufficient documentation

## 2021-02-02 DIAGNOSIS — M79662 Pain in left lower leg: Secondary | ICD-10-CM | POA: Diagnosis not present

## 2021-02-02 DIAGNOSIS — Z886 Allergy status to analgesic agent status: Secondary | ICD-10-CM | POA: Insufficient documentation

## 2021-02-02 DIAGNOSIS — M25512 Pain in left shoulder: Secondary | ICD-10-CM | POA: Insufficient documentation

## 2021-02-02 DIAGNOSIS — Z8673 Personal history of transient ischemic attack (TIA), and cerebral infarction without residual deficits: Secondary | ICD-10-CM | POA: Diagnosis not present

## 2021-02-02 MED ORDER — METHOCARBAMOL 500 MG PO TABS: 500.0000 mg | ORAL_TABLET | Freq: Two times a day (BID) | ORAL | 0 refills | Status: DC

## 2021-02-02 MED ORDER — METHOCARBAMOL 500 MG PO TABS: 500.0000 mg | ORAL_TABLET | Freq: Two times a day (BID) | ORAL | 0 refills | Status: AC

## 2021-02-02 MED ORDER — METHOCARBAMOL 500 MG PO TABS
500.0000 mg | ORAL_TABLET | Freq: Once | ORAL | Status: AC
  Administered 2021-02-02: 500 mg via ORAL
  Filled 2021-02-02: qty 1
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
## 2021-02-11 ENCOUNTER — Ambulatory Visit: Payer: Self-pay

## 2021-02-11 ENCOUNTER — Encounter: Payer: Self-pay | Admitting: Orthopedic Surgery

## 2021-02-11 ENCOUNTER — Ambulatory Visit (INDEPENDENT_AMBULATORY_CARE_PROVIDER_SITE_OTHER): Admitting: Orthopedic Surgery

## 2021-02-11 DIAGNOSIS — M545 Low back pain, unspecified: Secondary | ICD-10-CM

## 2021-02-11 DIAGNOSIS — M5440 Lumbago with sciatica, unspecified side: Secondary | ICD-10-CM

## 2021-02-11 DIAGNOSIS — M542 Cervicalgia: Secondary | ICD-10-CM

## 2021-02-11 MED ORDER — PREDNISONE 10 MG PO TABS: 10.0000 mg | ORAL_TABLET | Freq: Every day | ORAL | 1 refills | Status: AC

## 2021-02-16 ENCOUNTER — Encounter: Payer: Self-pay | Admitting: Orthopedic Surgery

## 2021-02-16 ENCOUNTER — Telehealth: Payer: Self-pay | Admitting: Orthopedic Surgery

## 2021-02-16 NOTE — Telephone Encounter (Signed)
I do not have any paperwork on this pt?

## 2021-02-16 NOTE — Telephone Encounter (Signed)
Sorry, form not with records or [facility].
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
## 2021-02-16 NOTE — Telephone Encounter (Signed)
Pt wondering if the paperwork about her being off of work has been filled out and if she can come pick it up today?   CB [phone]

## 2021-02-17 NOTE — Telephone Encounter (Signed)
Found form. This was completed during the pt's office visit. Additionally added that the pt is out of work for the next 4 weeks that we will re-eval at her visit on 03/09/21 and she should be able to return to work with the restrictions listed on side A of the form regular work hours. Printed off a copy of the pt's office visit note and gave this to her today and she will call if any other information is needed. Additionally I have another copy of the form for the visit on 03/09/21 and will complete this or rather update the form at the time of that visit.

## 2021-03-09 ENCOUNTER — Ambulatory Visit (INDEPENDENT_AMBULATORY_CARE_PROVIDER_SITE_OTHER): Admitting: Orthopedic Surgery

## 2021-03-09 ENCOUNTER — Other Ambulatory Visit: Payer: Self-pay

## 2021-03-09 ENCOUNTER — Encounter: Payer: Self-pay | Admitting: Orthopedic Surgery

## 2021-03-09 DIAGNOSIS — M5440 Lumbago with sciatica, unspecified side: Secondary | ICD-10-CM | POA: Diagnosis not present
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
## 2021-03-18 ENCOUNTER — Ambulatory Visit: Payer: Federal, State, Local not specified - PPO | Admitting: Physical Therapy

## 2021-03-18 ENCOUNTER — Other Ambulatory Visit: Payer: Self-pay

## 2021-03-18 DIAGNOSIS — M542 Cervicalgia: Secondary | ICD-10-CM

## 2021-03-18 DIAGNOSIS — M5442 Lumbago with sciatica, left side: Secondary | ICD-10-CM | POA: Diagnosis not present

## 2021-03-18 DIAGNOSIS — M6281 Muscle weakness (generalized): Secondary | ICD-10-CM

## 2021-03-18 NOTE — Patient Instructions (Signed)
Access Code: [email or web address] URL: [email or web address] Date: 03/18/2021 Prepared by: Ivery Quale  Exercises Supine Sciatic Nerve Glide - 2 x daily - 6 x weekly - 1 sets - 10 reps - 3 hold Supine Lower Trunk Rotation - 2 x daily - 6 x weekly - 1 sets - 10 reps - 5 sec hold Supine Piriformis Stretch with Foot on Ground - 2 x daily - 6 x weekly - 3 sets - 30 hold Supine Bridge - 2 x daily - 6 x weekly - 1-2 sets - 10 reps - 5 hold Supine Cervical Sidebending Stretch - 2 x daily - 6 x weekly - 1 sets - 3 reps - 15 sec hold Standing Lumbar Extension at Wall - Forearms - 2 x daily - 6 x weekly - 1-2 sets - 10 reps
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
## 2021-03-22 ENCOUNTER — Ambulatory Visit: Payer: Federal, State, Local not specified - PPO | Admitting: Physical Therapy

## 2021-03-30 ENCOUNTER — Encounter (HOSPITAL_COMMUNITY): Payer: Self-pay | Admitting: *Deleted

## 2021-03-30 ENCOUNTER — Encounter: Payer: Self-pay | Admitting: Physical Therapy

## 2021-03-30 ENCOUNTER — Ambulatory Visit: Payer: Federal, State, Local not specified - PPO | Admitting: Physical Therapy

## 2021-03-30 ENCOUNTER — Other Ambulatory Visit: Payer: Self-pay

## 2021-03-30 ENCOUNTER — Ambulatory Visit (HOSPITAL_COMMUNITY)
Admission: EM | Admit: 2021-03-30 | Discharge: 2021-03-30 | Disposition: A | Discharge status: Home / Self Care | Payer: Federal, State, Local not specified - PPO | Attending: Internal Medicine | Admitting: Internal Medicine

## 2021-03-30 DIAGNOSIS — M5442 Lumbago with sciatica, left side: Secondary | ICD-10-CM | POA: Diagnosis not present

## 2021-03-30 DIAGNOSIS — M542 Cervicalgia: Secondary | ICD-10-CM | POA: Diagnosis not present

## 2021-03-30 DIAGNOSIS — R300 Dysuria: Secondary | ICD-10-CM

## 2021-03-30 DIAGNOSIS — M6281 Muscle weakness (generalized): Secondary | ICD-10-CM | POA: Diagnosis not present

## 2021-03-30 LAB — POCT URINALYSIS DIPSTICK, ED / UC
Bilirubin Urine: NEGATIVE
Glucose, UA: NEGATIVE mg/dL
Ketones, ur: NEGATIVE mg/dL
Leukocytes,Ua: NEGATIVE
Nitrite: NEGATIVE
Protein, ur: NEGATIVE mg/dL
Specific Gravity, Urine: 1.01 (ref 1.005–1.030)
Urobilinogen, UA: 0.2 mg/dL (ref 0.0–1.0)
pH: 7 (ref 5.0–8.0)

## 2021-03-30 NOTE — ED Provider Notes (Signed)
[facility]    CSN: [id number] Arrival date & time: 03/30/21  1527      History   Chief Complaint Chief Complaint  Patient presents with   UTI    HPI Candice Reynolds is a 50 y.o. female.   Patient here for evaluation of dysuria, urgency, frequency, and lower back pain that has been ongoing for the past several days.  Reports recently completed course of macrobid but is still having symptoms.  Has not taken any OTC medications or treatments.  Denies any trauma, injury, or other precipitating event.  Denies any specific alleviating or aggravating factors.  Denies any fevers, chest pain, shortness of breath, N/V/D, numbness, tingling, weakness, abdominal pain, or headaches.    The history is provided by the patient.   Past Medical History:  Diagnosis Date   Bipolar disorder (HCC)    "very low stage" (01/16/2014)   Migraine    "not as often anymore; take RX qd" (01/16/2014)   Stroke (HCC) ~ 2006   "light"    Patient Active Problem List   Diagnosis Date Noted   HLD (hyperlipidemia) 01/17/2014   Chest pain at rest 01/16/2014   Hypertension 01/16/2014    Past Surgical History:  Procedure Laterality Date   BUNIONECTOMY Bilateral    REFRACTIVE SURGERY Bilateral ~ 2001   TONSILLECTOMY     TUBAL LIGATION      OB History   No obstetric history on file.      Home Medications    Prior to Admission medications   Medication Sig Start Date End Date Taking? Authorizing Provider  ALPRAZolam Prudy Feeler) 0.5 MG tablet Take 0.5 mg by mouth 4 (four) times daily as needed for anxiety.  11/26/13  Yes [provider]  ibuprofen (ADVIL,MOTRIN) 600 MG tablet Take 1 tablet (600 mg total) by mouth every 6 (six) hours as needed. 06/01/17  Yes Domenick Gong, MD  lamoTRIgine (LAMICTAL) 200 MG tablet Take 200 mg by mouth at bedtime.   Yes [provider]  Multiple Vitamins-Minerals (MULTIVITAL PO) Take 1 tablet by mouth daily.   Yes [provider]   propranolol (INDERAL) 10 MG tablet Take 10 mg by mouth 4 (four) times daily as needed (anxiety).  01/03/14  Yes [provider]  citalopram (CELEXA) 20 MG tablet Take 20 mg by mouth daily. 12/26/13   [provider]  methocarbamol (ROBAXIN) 500 MG tablet Take 1 tablet (500 mg total) by mouth 2 (two) times daily. 02/02/21   Farrel Gordon, PA-C  omeprazole (PRILOSEC) 40 MG capsule Take 40 mg by mouth daily. 01/14/16   [provider]  perphenazine (TRILAFON) 2 MG tablet Take 2 mg by mouth daily. 12/26/13   [provider]  predniSONE (DELTASONE) 10 MG tablet Take 1 tablet (10 mg total) by mouth daily with breakfast. 02/11/21   Nadara Mustard, MD    Family History Family History  Problem Relation Age of Onset   CAD Father    CAD Paternal Grandmother     Social History Social History   Tobacco Use   Smoking status: Never   Smokeless tobacco: Never  Substance Use Topics   Alcohol use: Yes    Alcohol/week: 7.0 standard drinks    Types: 7 Cans of beer per week    Comment: "@ least 1 beer/day"   Drug use: No     Allergies   Aspirin   Review of Systems Review of Systems  Genitourinary:  Positive for dysuria, frequency and urgency.  All other systems reviewed and are negative.   Physical Exam Triage Vital Signs ED Triage Vitals  Enc Vitals Group     BP 03/30/21 1552 (!) 151/95     Pulse Rate 03/30/21 1552 66     Resp 03/30/21 1552 18     Temp 03/30/21 1552 98.4 F (36.9 C)     Temp src --      SpO2 03/30/21 1552 100 %     Weight --      Height --      Head Circumference --      Peak Flow --      Pain Score 03/30/21 1554 6     Pain Loc --      Pain Edu? --      Excl. in GC? --    No data found.  Updated Vital Signs BP (!) 151/95   Pulse 66   Temp 98.4 F (36.9 C)   Resp 18   SpO2 100%   Visual Acuity Right Eye Distance:   Left Eye Distance:   Bilateral Distance:    Right Eye Near:   Left Eye Near:    Bilateral Near:      Physical Exam Vitals and nursing note reviewed.  Constitutional:      General: She is not in acute distress.    Appearance: Normal appearance. She is not ill-appearing, toxic-appearing or diaphoretic.  HENT:     Head: Normocephalic and atraumatic.  Eyes:     Conjunctiva/sclera: Conjunctivae normal.  Cardiovascular:     Rate and Rhythm: Normal rate.     Pulses: Normal pulses.  Pulmonary:     Effort: Pulmonary effort is normal.  Abdominal:     General: Abdomen is flat.     Tenderness: There is no right CVA tenderness or left CVA tenderness.  Musculoskeletal:        General: Normal range of motion.     Cervical back: Normal range of motion.  Skin:    General: Skin is warm and dry.  Neurological:     General: No focal deficit present.     Mental Status: She is alert and oriented to person, place, and time.  Psychiatric:        Mood and Affect: Mood normal.     UC Treatments / Results  Labs (all labs ordered are listed, but only abnormal results are displayed) Labs Reviewed  POCT URINALYSIS DIPSTICK, ED / UC - Abnormal; Notable for the following components:      Result Value   Hgb urine dipstick TRACE (*)    All other components within normal limits  URINE CULTURE    EKG   Radiology No results found.  Procedures Procedures (including critical care time)  Medications Ordered in UC Medications - No data to display  Initial Impression / Assessment and Plan / UC Course  I have reviewed the triage vital signs and the nursing notes.  Pertinent labs & imaging results that were available during my care of the patient were reviewed by me and considered in my medical decision making (see chart for details).    Assessment negative for red flags or concerns.  Urinalysis with no signs of infection.  Urine culture pending.  Recommend fluids and rest.  Discussed conservative symptom management.  Follow up as needed.  Final Clinical Impressions(s) / UC Diagnoses   Final  diagnoses:  Dysuria     Discharge Instructions      You can take Tylenol and/or Ibuprofen  as needed for pain relief and fever reduction.   Make sure you are drinking plenty of fluids, especially water.  You can drink cranberry juice to help with symptom relief, but make sure it is cranberry juice and not cranberry cocktail.  You can also try AZO, cranberry pills, or pyridium as needed.    Return or go to the Emergency Department if symptoms worsen or do not improve in the next few days.      ED Prescriptions   None    PDMP not reviewed this encounter.   Ivette Loyal, NP 03/30/21 [phone]

## 2021-03-30 NOTE — ED Triage Notes (Signed)
Ot reports having a UTI.
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
## 2021-03-30 NOTE — Discharge Instructions (Addendum)
You can take Tylenol and/or Ibuprofen as needed for pain relief and fever reduction.   Make sure you are drinking plenty of fluids, especially water.  You can drink cranberry juice to help with symptom relief, but make sure it is cranberry juice and not cranberry cocktail.  You can also try AZO, cranberry pills, or pyridium as needed.    Return or go to the Emergency Department if symptoms worsen or do not improve in the next few days.  

## 2021-04-01 LAB — URINE CULTURE: Culture: <10000 — AB

## 2021-04-05 ENCOUNTER — Encounter: Payer: Self-pay | Admitting: Rehabilitative and Restorative Service Providers"

## 2021-04-05 ENCOUNTER — Ambulatory Visit: Payer: Federal, State, Local not specified - PPO | Admitting: Rehabilitative and Restorative Service Providers"

## 2021-04-05 ENCOUNTER — Other Ambulatory Visit: Payer: Self-pay

## 2021-04-05 DIAGNOSIS — M5442 Lumbago with sciatica, left side: Secondary | ICD-10-CM

## 2021-04-05 DIAGNOSIS — M6281 Muscle weakness (generalized): Secondary | ICD-10-CM | POA: Diagnosis not present

## 2021-04-05 DIAGNOSIS — M542 Cervicalgia: Secondary | ICD-10-CM | POA: Diagnosis not present
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
## 2021-04-06 DIAGNOSIS — F411 Generalized anxiety disorder: Secondary | ICD-10-CM | POA: Diagnosis not present

## 2021-04-06 DIAGNOSIS — F3181 Bipolar II disorder: Secondary | ICD-10-CM | POA: Diagnosis not present

## 2021-04-07 ENCOUNTER — Ambulatory Visit: Payer: Federal, State, Local not specified - PPO | Admitting: Physical Therapy

## 2021-04-07 ENCOUNTER — Other Ambulatory Visit: Payer: Self-pay

## 2021-04-07 DIAGNOSIS — M542 Cervicalgia: Secondary | ICD-10-CM

## 2021-04-07 DIAGNOSIS — M5442 Lumbago with sciatica, left side: Secondary | ICD-10-CM | POA: Diagnosis not present
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
## 2021-04-12 ENCOUNTER — Encounter: Payer: Federal, State, Local not specified - PPO | Admitting: Rehabilitative and Restorative Service Providers"

## 2021-04-13 ENCOUNTER — Ambulatory Visit: Payer: Federal, State, Local not specified - PPO | Admitting: Physical Therapy

## 2021-04-13 ENCOUNTER — Other Ambulatory Visit: Payer: Self-pay

## 2021-04-13 DIAGNOSIS — M542 Cervicalgia: Secondary | ICD-10-CM

## 2021-04-13 DIAGNOSIS — M5442 Lumbago with sciatica, left side: Secondary | ICD-10-CM

## 2021-04-13 DIAGNOSIS — M6281 Muscle weakness (generalized): Secondary | ICD-10-CM | POA: Diagnosis not present
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
## 2021-04-20 ENCOUNTER — Encounter: Payer: Federal, State, Local not specified - PPO | Admitting: Rehabilitative and Restorative Service Providers"

## 2021-04-27 ENCOUNTER — Ambulatory Visit: Payer: Federal, State, Local not specified - PPO | Admitting: Physical Therapy

## 2021-04-27 ENCOUNTER — Other Ambulatory Visit: Payer: Self-pay

## 2021-04-27 DIAGNOSIS — M5442 Lumbago with sciatica, left side: Secondary | ICD-10-CM | POA: Diagnosis not present

## 2021-04-27 DIAGNOSIS — M6281 Muscle weakness (generalized): Secondary | ICD-10-CM

## 2021-04-27 DIAGNOSIS — M542 Cervicalgia: Secondary | ICD-10-CM

## 2021-04-28 ENCOUNTER — Encounter: Payer: Federal, State, Local not specified - PPO | Admitting: Physical Therapy
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
## 2021-05-04 ENCOUNTER — Encounter: Payer: Self-pay | Admitting: Physical Therapy

## 2021-05-04 ENCOUNTER — Other Ambulatory Visit: Payer: Self-pay

## 2021-05-04 ENCOUNTER — Ambulatory Visit: Payer: Federal, State, Local not specified - PPO | Admitting: Physical Therapy

## 2021-05-04 DIAGNOSIS — M6281 Muscle weakness (generalized): Secondary | ICD-10-CM

## 2021-05-04 DIAGNOSIS — M5442 Lumbago with sciatica, left side: Secondary | ICD-10-CM

## 2021-05-04 DIAGNOSIS — M542 Cervicalgia: Secondary | ICD-10-CM | POA: Diagnosis not present
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
## 2021-05-05 ENCOUNTER — Encounter: Payer: Federal, State, Local not specified - PPO | Admitting: Physical Therapy

## 2021-05-06 ENCOUNTER — Encounter: Payer: Self-pay | Admitting: Rehabilitative and Restorative Service Providers"

## 2021-05-06 ENCOUNTER — Ambulatory Visit: Payer: Federal, State, Local not specified - PPO | Admitting: Rehabilitative and Restorative Service Providers"

## 2021-05-06 ENCOUNTER — Other Ambulatory Visit: Payer: Self-pay

## 2021-05-06 DIAGNOSIS — M542 Cervicalgia: Secondary | ICD-10-CM

## 2021-05-06 DIAGNOSIS — M5442 Lumbago with sciatica, left side: Secondary | ICD-10-CM

## 2021-05-06 DIAGNOSIS — M6281 Muscle weakness (generalized): Secondary | ICD-10-CM
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
## 2021-05-10 ENCOUNTER — Encounter: Payer: Federal, State, Local not specified - PPO | Admitting: Physical Therapy

## 2021-05-18 ENCOUNTER — Ambulatory Visit: Payer: Federal, State, Local not specified - PPO | Admitting: Physical Therapy

## 2021-05-18 ENCOUNTER — Other Ambulatory Visit: Payer: Self-pay

## 2021-05-18 DIAGNOSIS — M5442 Lumbago with sciatica, left side: Secondary | ICD-10-CM | POA: Diagnosis not present

## 2021-05-18 DIAGNOSIS — M542 Cervicalgia: Secondary | ICD-10-CM | POA: Diagnosis not present

## 2021-05-18 DIAGNOSIS — M6281 Muscle weakness (generalized): Secondary | ICD-10-CM | POA: Diagnosis not present
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
## 2021-05-20 ENCOUNTER — Encounter: Payer: Federal, State, Local not specified - PPO | Admitting: Physical Therapy

## 2021-07-22 DIAGNOSIS — F3181 Bipolar II disorder: Secondary | ICD-10-CM | POA: Diagnosis not present

## 2021-07-22 DIAGNOSIS — F411 Generalized anxiety disorder: Secondary | ICD-10-CM | POA: Diagnosis not present

## 2021-11-04 DIAGNOSIS — F411 Generalized anxiety disorder: Secondary | ICD-10-CM | POA: Diagnosis not present

## 2021-11-04 DIAGNOSIS — F3181 Bipolar II disorder: Secondary | ICD-10-CM | POA: Diagnosis not present

## 2021-11-04 DIAGNOSIS — Z79899 Other long term (current) drug therapy: Secondary | ICD-10-CM | POA: Diagnosis not present

## 2021-11-14 DIAGNOSIS — J069 Acute upper respiratory infection, unspecified: Secondary | ICD-10-CM | POA: Diagnosis not present

## 2021-11-22 DIAGNOSIS — R21 Rash and other nonspecific skin eruption: Secondary | ICD-10-CM | POA: Diagnosis not present

## 2021-11-22 DIAGNOSIS — Z7901 Long term (current) use of anticoagulants: Secondary | ICD-10-CM | POA: Diagnosis not present

## 2021-11-22 DIAGNOSIS — Z792 Long term (current) use of antibiotics: Secondary | ICD-10-CM | POA: Diagnosis not present

## 2021-11-22 DIAGNOSIS — L03211 Cellulitis of face: Secondary | ICD-10-CM | POA: Diagnosis not present

## 2021-11-22 DIAGNOSIS — Z7952 Long term (current) use of systemic steroids: Secondary | ICD-10-CM | POA: Diagnosis not present

## 2021-11-22 DIAGNOSIS — Z79899 Other long term (current) drug therapy: Secondary | ICD-10-CM | POA: Diagnosis not present

## 2021-11-22 DIAGNOSIS — Z2821 Immunization not carried out because of patient refusal: Secondary | ICD-10-CM | POA: Diagnosis not present

## 2021-11-22 DIAGNOSIS — F411 Generalized anxiety disorder: Secondary | ICD-10-CM | POA: Diagnosis not present

## 2021-11-22 DIAGNOSIS — Z79891 Long term (current) use of opiate analgesic: Secondary | ICD-10-CM | POA: Diagnosis not present

## 2021-11-22 DIAGNOSIS — A46 Erysipelas: Secondary | ICD-10-CM | POA: Diagnosis not present

## 2021-11-22 DIAGNOSIS — L930 Discoid lupus erythematosus: Secondary | ICD-10-CM | POA: Diagnosis not present

## 2021-11-22 DIAGNOSIS — F32A Depression, unspecified: Secondary | ICD-10-CM | POA: Diagnosis not present

## 2021-11-25 DIAGNOSIS — B001 Herpesviral vesicular dermatitis: Secondary | ICD-10-CM | POA: Diagnosis not present

## 2021-11-25 DIAGNOSIS — K219 Gastro-esophageal reflux disease without esophagitis: Secondary | ICD-10-CM | POA: Diagnosis not present

## 2021-11-25 DIAGNOSIS — L309 Dermatitis, unspecified: Secondary | ICD-10-CM | POA: Diagnosis not present

## 2021-11-25 DIAGNOSIS — L03211 Cellulitis of face: Secondary | ICD-10-CM | POA: Diagnosis not present

## 2021-12-06 DIAGNOSIS — G43909 Migraine, unspecified, not intractable, without status migrainosus: Secondary | ICD-10-CM | POA: Diagnosis not present

## 2021-12-06 DIAGNOSIS — A46 Erysipelas: Secondary | ICD-10-CM | POA: Diagnosis not present

## 2021-12-06 DIAGNOSIS — R490 Dysphonia: Secondary | ICD-10-CM | POA: Diagnosis not present

## 2022-01-06 DIAGNOSIS — Z0000 Encounter for general adult medical examination without abnormal findings: Secondary | ICD-10-CM | POA: Diagnosis not present

## 2022-01-06 DIAGNOSIS — N390 Urinary tract infection, site not specified: Secondary | ICD-10-CM | POA: Diagnosis not present

## 2022-01-06 DIAGNOSIS — B001 Herpesviral vesicular dermatitis: Secondary | ICD-10-CM | POA: Diagnosis not present

## 2022-01-06 DIAGNOSIS — E7800 Pure hypercholesterolemia, unspecified: Secondary | ICD-10-CM | POA: Diagnosis not present

## 2022-01-06 DIAGNOSIS — F319 Bipolar disorder, unspecified: Secondary | ICD-10-CM | POA: Diagnosis not present

## 2022-01-06 DIAGNOSIS — M2550 Pain in unspecified joint: Secondary | ICD-10-CM | POA: Diagnosis not present

## 2022-01-27 DIAGNOSIS — Z79899 Other long term (current) drug therapy: Secondary | ICD-10-CM | POA: Diagnosis not present

## 2022-01-27 DIAGNOSIS — F411 Generalized anxiety disorder: Secondary | ICD-10-CM | POA: Diagnosis not present

## 2022-01-27 DIAGNOSIS — F3181 Bipolar II disorder: Secondary | ICD-10-CM | POA: Diagnosis not present

## 2022-01-27 DIAGNOSIS — G4700 Insomnia, unspecified: Secondary | ICD-10-CM | POA: Diagnosis not present

## 2022-03-01 DIAGNOSIS — Z124 Encounter for screening for malignant neoplasm of cervix: Secondary | ICD-10-CM | POA: Diagnosis not present

## 2022-03-01 DIAGNOSIS — Z01419 Encounter for gynecological examination (general) (routine) without abnormal findings: Secondary | ICD-10-CM | POA: Diagnosis not present

## 2022-03-01 DIAGNOSIS — Z1151 Encounter for screening for human papillomavirus (HPV): Secondary | ICD-10-CM | POA: Diagnosis not present

## 2022-03-01 DIAGNOSIS — Z6824 Body mass index (BMI) 24.0-24.9, adult: Secondary | ICD-10-CM | POA: Diagnosis not present

## 2022-03-30 DIAGNOSIS — M7712 Lateral epicondylitis, left elbow: Secondary | ICD-10-CM | POA: Diagnosis not present

## 2022-04-15 DIAGNOSIS — R252 Cramp and spasm: Secondary | ICD-10-CM | POA: Diagnosis not present

## 2022-04-15 DIAGNOSIS — K648 Other hemorrhoids: Secondary | ICD-10-CM | POA: Diagnosis not present

## 2022-04-22 DIAGNOSIS — F3181 Bipolar II disorder: Secondary | ICD-10-CM | POA: Diagnosis not present

## 2022-04-22 DIAGNOSIS — Z79899 Other long term (current) drug therapy: Secondary | ICD-10-CM | POA: Diagnosis not present

## 2022-04-22 DIAGNOSIS — G4700 Insomnia, unspecified: Secondary | ICD-10-CM | POA: Diagnosis not present

## 2022-04-22 DIAGNOSIS — F411 Generalized anxiety disorder: Secondary | ICD-10-CM | POA: Diagnosis not present

## 2022-08-23 DIAGNOSIS — G250 Essential tremor: Secondary | ICD-10-CM | POA: Diagnosis not present

## 2022-08-23 DIAGNOSIS — F411 Generalized anxiety disorder: Secondary | ICD-10-CM | POA: Diagnosis not present

## 2022-08-23 DIAGNOSIS — F3181 Bipolar II disorder: Secondary | ICD-10-CM | POA: Diagnosis not present

## 2022-08-23 DIAGNOSIS — G4700 Insomnia, unspecified: Secondary | ICD-10-CM | POA: Diagnosis not present

## 2022-09-30 DIAGNOSIS — M7712 Lateral epicondylitis, left elbow: Secondary | ICD-10-CM | POA: Diagnosis not present

## 2022-12-20 DIAGNOSIS — F5101 Primary insomnia: Secondary | ICD-10-CM | POA: Diagnosis not present

## 2022-12-20 DIAGNOSIS — F411 Generalized anxiety disorder: Secondary | ICD-10-CM | POA: Diagnosis not present

## 2022-12-20 DIAGNOSIS — F3181 Bipolar II disorder: Secondary | ICD-10-CM | POA: Diagnosis not present

## 2022-12-20 DIAGNOSIS — G250 Essential tremor: Secondary | ICD-10-CM | POA: Diagnosis not present

## 2023-01-11 DIAGNOSIS — E7800 Pure hypercholesterolemia, unspecified: Secondary | ICD-10-CM | POA: Diagnosis not present

## 2023-01-11 DIAGNOSIS — F319 Bipolar disorder, unspecified: Secondary | ICD-10-CM | POA: Diagnosis not present

## 2023-01-11 DIAGNOSIS — R5383 Other fatigue: Secondary | ICD-10-CM | POA: Diagnosis not present

## 2023-01-11 DIAGNOSIS — Z0000 Encounter for general adult medical examination without abnormal findings: Secondary | ICD-10-CM | POA: Diagnosis not present

## 2023-01-11 DIAGNOSIS — M2550 Pain in unspecified joint: Secondary | ICD-10-CM | POA: Diagnosis not present

## 2023-02-01 DIAGNOSIS — R110 Nausea: Secondary | ICD-10-CM | POA: Diagnosis not present

## 2023-02-01 DIAGNOSIS — M47812 Spondylosis without myelopathy or radiculopathy, cervical region: Secondary | ICD-10-CM | POA: Diagnosis not present

## 2023-02-01 DIAGNOSIS — I6523 Occlusion and stenosis of bilateral carotid arteries: Secondary | ICD-10-CM | POA: Diagnosis not present

## 2023-02-01 DIAGNOSIS — R1111 Vomiting without nausea: Secondary | ICD-10-CM | POA: Diagnosis not present

## 2023-02-01 DIAGNOSIS — I639 Cerebral infarction, unspecified: Secondary | ICD-10-CM | POA: Diagnosis not present

## 2023-02-01 DIAGNOSIS — G250 Essential tremor: Secondary | ICD-10-CM | POA: Diagnosis not present

## 2023-02-01 DIAGNOSIS — R930 Abnormal findings on diagnostic imaging of skull and head, not elsewhere classified: Secondary | ICD-10-CM | POA: Diagnosis not present

## 2023-02-01 DIAGNOSIS — F411 Generalized anxiety disorder: Secondary | ICD-10-CM | POA: Diagnosis not present

## 2023-02-01 DIAGNOSIS — X58XXXD Exposure to other specified factors, subsequent encounter: Secondary | ICD-10-CM | POA: Diagnosis not present

## 2023-02-01 DIAGNOSIS — Z79899 Other long term (current) drug therapy: Secondary | ICD-10-CM | POA: Diagnosis not present

## 2023-02-01 DIAGNOSIS — S060X0D Concussion without loss of consciousness, subsequent encounter: Secondary | ICD-10-CM | POA: Diagnosis not present

## 2023-02-01 DIAGNOSIS — G969 Disorder of central nervous system, unspecified: Secondary | ICD-10-CM | POA: Diagnosis not present

## 2023-02-01 DIAGNOSIS — F3181 Bipolar II disorder: Secondary | ICD-10-CM | POA: Diagnosis not present

## 2023-02-01 DIAGNOSIS — F5111 Primary hypersomnia: Secondary | ICD-10-CM | POA: Diagnosis not present

## 2023-02-01 DIAGNOSIS — R262 Difficulty in walking, not elsewhere classified: Secondary | ICD-10-CM | POA: Diagnosis not present

## 2023-02-01 DIAGNOSIS — R531 Weakness: Secondary | ICD-10-CM | POA: Diagnosis not present

## 2023-02-01 DIAGNOSIS — G4489 Other headache syndrome: Secondary | ICD-10-CM | POA: Diagnosis not present

## 2023-02-01 DIAGNOSIS — R42 Dizziness and giddiness: Secondary | ICD-10-CM | POA: Diagnosis not present

## 2023-02-02 DIAGNOSIS — R42 Dizziness and giddiness: Secondary | ICD-10-CM | POA: Diagnosis not present

## 2023-02-02 DIAGNOSIS — R29818 Other symptoms and signs involving the nervous system: Secondary | ICD-10-CM | POA: Diagnosis not present

## 2023-02-02 DIAGNOSIS — I517 Cardiomegaly: Secondary | ICD-10-CM | POA: Diagnosis not present

## 2023-02-03 DIAGNOSIS — M4802 Spinal stenosis, cervical region: Secondary | ICD-10-CM | POA: Diagnosis not present

## 2023-02-03 DIAGNOSIS — R42 Dizziness and giddiness: Secondary | ICD-10-CM | POA: Diagnosis not present

## 2023-02-03 DIAGNOSIS — M542 Cervicalgia: Secondary | ICD-10-CM | POA: Diagnosis not present

## 2023-02-03 DIAGNOSIS — M4312 Spondylolisthesis, cervical region: Secondary | ICD-10-CM | POA: Diagnosis not present

## 2023-02-04 DIAGNOSIS — R42 Dizziness and giddiness: Secondary | ICD-10-CM | POA: Diagnosis not present

## 2023-02-05 DIAGNOSIS — R42 Dizziness and giddiness: Secondary | ICD-10-CM | POA: Diagnosis not present

## 2023-02-06 DIAGNOSIS — R42 Dizziness and giddiness: Secondary | ICD-10-CM | POA: Diagnosis not present

## 2023-02-07 DIAGNOSIS — R531 Weakness: Secondary | ICD-10-CM | POA: Diagnosis not present

## 2023-02-07 DIAGNOSIS — R42 Dizziness and giddiness: Secondary | ICD-10-CM | POA: Diagnosis not present

## 2023-02-09 DIAGNOSIS — R42 Dizziness and giddiness: Secondary | ICD-10-CM | POA: Diagnosis not present

## 2023-02-09 DIAGNOSIS — R531 Weakness: Secondary | ICD-10-CM | POA: Diagnosis not present

## 2023-02-13 DIAGNOSIS — R825 Elevated urine levels of drugs, medicaments and biological substances: Secondary | ICD-10-CM | POA: Diagnosis not present

## 2023-02-13 DIAGNOSIS — R531 Weakness: Secondary | ICD-10-CM | POA: Diagnosis not present

## 2023-02-13 DIAGNOSIS — G250 Essential tremor: Secondary | ICD-10-CM | POA: Diagnosis not present

## 2023-02-13 DIAGNOSIS — R42 Dizziness and giddiness: Secondary | ICD-10-CM | POA: Diagnosis not present

## 2023-02-15 DIAGNOSIS — R531 Weakness: Secondary | ICD-10-CM | POA: Diagnosis not present

## 2023-02-15 DIAGNOSIS — R42 Dizziness and giddiness: Secondary | ICD-10-CM | POA: Diagnosis not present

## 2023-02-15 DIAGNOSIS — G250 Essential tremor: Secondary | ICD-10-CM | POA: Diagnosis not present

## 2023-02-15 DIAGNOSIS — R825 Elevated urine levels of drugs, medicaments and biological substances: Secondary | ICD-10-CM | POA: Diagnosis not present

## 2023-02-20 DIAGNOSIS — R825 Elevated urine levels of drugs, medicaments and biological substances: Secondary | ICD-10-CM | POA: Diagnosis not present

## 2023-02-20 DIAGNOSIS — R531 Weakness: Secondary | ICD-10-CM | POA: Diagnosis not present

## 2023-02-20 DIAGNOSIS — G250 Essential tremor: Secondary | ICD-10-CM | POA: Diagnosis not present

## 2023-02-20 DIAGNOSIS — R42 Dizziness and giddiness: Secondary | ICD-10-CM | POA: Diagnosis not present

## 2023-02-22 DIAGNOSIS — G250 Essential tremor: Secondary | ICD-10-CM | POA: Diagnosis not present

## 2023-02-22 DIAGNOSIS — R42 Dizziness and giddiness: Secondary | ICD-10-CM | POA: Diagnosis not present

## 2023-02-22 DIAGNOSIS — R825 Elevated urine levels of drugs, medicaments and biological substances: Secondary | ICD-10-CM | POA: Diagnosis not present

## 2023-02-22 DIAGNOSIS — R531 Weakness: Secondary | ICD-10-CM | POA: Diagnosis not present

## 2023-02-27 DIAGNOSIS — R42 Dizziness and giddiness: Secondary | ICD-10-CM | POA: Diagnosis not present

## 2023-02-27 DIAGNOSIS — G250 Essential tremor: Secondary | ICD-10-CM | POA: Diagnosis not present

## 2023-02-27 DIAGNOSIS — R531 Weakness: Secondary | ICD-10-CM | POA: Diagnosis not present

## 2023-02-27 DIAGNOSIS — R825 Elevated urine levels of drugs, medicaments and biological substances: Secondary | ICD-10-CM | POA: Diagnosis not present

## 2023-03-01 DIAGNOSIS — R825 Elevated urine levels of drugs, medicaments and biological substances: Secondary | ICD-10-CM | POA: Diagnosis not present

## 2023-03-01 DIAGNOSIS — R531 Weakness: Secondary | ICD-10-CM | POA: Diagnosis not present

## 2023-03-01 DIAGNOSIS — G250 Essential tremor: Secondary | ICD-10-CM | POA: Diagnosis not present

## 2023-03-01 DIAGNOSIS — R42 Dizziness and giddiness: Secondary | ICD-10-CM | POA: Diagnosis not present

## 2023-03-06 DIAGNOSIS — R42 Dizziness and giddiness: Secondary | ICD-10-CM | POA: Diagnosis not present

## 2023-03-06 DIAGNOSIS — R531 Weakness: Secondary | ICD-10-CM | POA: Diagnosis not present

## 2023-03-06 DIAGNOSIS — R825 Elevated urine levels of drugs, medicaments and biological substances: Secondary | ICD-10-CM | POA: Diagnosis not present

## 2023-03-06 DIAGNOSIS — G250 Essential tremor: Secondary | ICD-10-CM | POA: Diagnosis not present

## 2023-03-08 DIAGNOSIS — R825 Elevated urine levels of drugs, medicaments and biological substances: Secondary | ICD-10-CM | POA: Diagnosis not present

## 2023-03-08 DIAGNOSIS — G250 Essential tremor: Secondary | ICD-10-CM | POA: Diagnosis not present

## 2023-03-08 DIAGNOSIS — R531 Weakness: Secondary | ICD-10-CM | POA: Diagnosis not present

## 2023-03-08 DIAGNOSIS — R42 Dizziness and giddiness: Secondary | ICD-10-CM | POA: Diagnosis not present

## 2023-03-13 DIAGNOSIS — R42 Dizziness and giddiness: Secondary | ICD-10-CM | POA: Diagnosis not present

## 2023-03-13 DIAGNOSIS — R531 Weakness: Secondary | ICD-10-CM | POA: Diagnosis not present

## 2023-03-13 DIAGNOSIS — G250 Essential tremor: Secondary | ICD-10-CM | POA: Diagnosis not present

## 2023-03-13 DIAGNOSIS — R825 Elevated urine levels of drugs, medicaments and biological substances: Secondary | ICD-10-CM | POA: Diagnosis not present

## 2023-03-15 DIAGNOSIS — H8111 Benign paroxysmal vertigo, right ear: Secondary | ICD-10-CM | POA: Diagnosis not present

## 2023-03-15 DIAGNOSIS — R531 Weakness: Secondary | ICD-10-CM | POA: Diagnosis not present

## 2023-03-15 DIAGNOSIS — R42 Dizziness and giddiness: Secondary | ICD-10-CM | POA: Diagnosis not present

## 2023-03-15 DIAGNOSIS — N179 Acute kidney failure, unspecified: Secondary | ICD-10-CM | POA: Diagnosis not present

## 2023-03-15 DIAGNOSIS — D72829 Elevated white blood cell count, unspecified: Secondary | ICD-10-CM | POA: Diagnosis not present

## 2023-03-15 DIAGNOSIS — R2689 Other abnormalities of gait and mobility: Secondary | ICD-10-CM | POA: Diagnosis not present

## 2023-03-15 DIAGNOSIS — R825 Elevated urine levels of drugs, medicaments and biological substances: Secondary | ICD-10-CM | POA: Diagnosis not present

## 2023-03-15 DIAGNOSIS — G250 Essential tremor: Secondary | ICD-10-CM | POA: Diagnosis not present

## 2023-03-15 DIAGNOSIS — R930 Abnormal findings on diagnostic imaging of skull and head, not elsewhere classified: Secondary | ICD-10-CM | POA: Diagnosis not present

## 2023-03-15 DIAGNOSIS — F0781 Postconcussional syndrome: Secondary | ICD-10-CM | POA: Diagnosis not present

## 2023-03-20 DIAGNOSIS — G250 Essential tremor: Secondary | ICD-10-CM | POA: Diagnosis not present

## 2023-03-20 DIAGNOSIS — R531 Weakness: Secondary | ICD-10-CM | POA: Diagnosis not present

## 2023-03-20 DIAGNOSIS — R825 Elevated urine levels of drugs, medicaments and biological substances: Secondary | ICD-10-CM | POA: Diagnosis not present

## 2023-03-20 DIAGNOSIS — H539 Unspecified visual disturbance: Secondary | ICD-10-CM | POA: Diagnosis not present

## 2023-03-20 DIAGNOSIS — R42 Dizziness and giddiness: Secondary | ICD-10-CM | POA: Diagnosis not present

## 2023-03-21 DIAGNOSIS — R930 Abnormal findings on diagnostic imaging of skull and head, not elsewhere classified: Secondary | ICD-10-CM | POA: Diagnosis not present

## 2023-03-21 DIAGNOSIS — R9082 White matter disease, unspecified: Secondary | ICD-10-CM | POA: Diagnosis not present

## 2023-03-22 DIAGNOSIS — H539 Unspecified visual disturbance: Secondary | ICD-10-CM | POA: Diagnosis not present

## 2023-03-22 DIAGNOSIS — G250 Essential tremor: Secondary | ICD-10-CM | POA: Diagnosis not present

## 2023-03-22 DIAGNOSIS — R42 Dizziness and giddiness: Secondary | ICD-10-CM | POA: Diagnosis not present

## 2023-03-22 DIAGNOSIS — R531 Weakness: Secondary | ICD-10-CM | POA: Diagnosis not present

## 2023-03-22 DIAGNOSIS — R825 Elevated urine levels of drugs, medicaments and biological substances: Secondary | ICD-10-CM | POA: Diagnosis not present

## 2023-03-29 DIAGNOSIS — R531 Weakness: Secondary | ICD-10-CM | POA: Diagnosis not present

## 2023-03-29 DIAGNOSIS — G250 Essential tremor: Secondary | ICD-10-CM | POA: Diagnosis not present

## 2023-03-29 DIAGNOSIS — R42 Dizziness and giddiness: Secondary | ICD-10-CM | POA: Diagnosis not present

## 2023-03-29 DIAGNOSIS — H539 Unspecified visual disturbance: Secondary | ICD-10-CM | POA: Diagnosis not present

## 2023-03-29 DIAGNOSIS — R825 Elevated urine levels of drugs, medicaments and biological substances: Secondary | ICD-10-CM | POA: Diagnosis not present

## 2023-04-18 DIAGNOSIS — G250 Essential tremor: Secondary | ICD-10-CM | POA: Diagnosis not present

## 2023-04-18 DIAGNOSIS — F411 Generalized anxiety disorder: Secondary | ICD-10-CM | POA: Diagnosis not present

## 2023-04-18 DIAGNOSIS — F3181 Bipolar II disorder: Secondary | ICD-10-CM | POA: Diagnosis not present

## 2023-04-18 DIAGNOSIS — F5101 Primary insomnia: Secondary | ICD-10-CM | POA: Diagnosis not present

## 2023-05-10 DIAGNOSIS — H8111 Benign paroxysmal vertigo, right ear: Secondary | ICD-10-CM | POA: Diagnosis not present

## 2023-05-10 DIAGNOSIS — F0781 Postconcussional syndrome: Secondary | ICD-10-CM | POA: Diagnosis not present

## 2023-05-24 DIAGNOSIS — G250 Essential tremor: Secondary | ICD-10-CM | POA: Diagnosis not present

## 2023-05-24 DIAGNOSIS — R825 Elevated urine levels of drugs, medicaments and biological substances: Secondary | ICD-10-CM | POA: Diagnosis not present

## 2023-05-24 DIAGNOSIS — R42 Dizziness and giddiness: Secondary | ICD-10-CM | POA: Diagnosis not present

## 2023-05-24 DIAGNOSIS — M542 Cervicalgia: Secondary | ICD-10-CM | POA: Diagnosis not present

## 2023-05-24 DIAGNOSIS — F0781 Postconcussional syndrome: Secondary | ICD-10-CM | POA: Diagnosis not present

## 2023-05-24 DIAGNOSIS — H539 Unspecified visual disturbance: Secondary | ICD-10-CM | POA: Diagnosis not present

## 2023-05-24 DIAGNOSIS — R531 Weakness: Secondary | ICD-10-CM | POA: Diagnosis not present

## 2023-06-07 DIAGNOSIS — M542 Cervicalgia: Secondary | ICD-10-CM | POA: Diagnosis not present

## 2023-06-07 DIAGNOSIS — F0781 Postconcussional syndrome: Secondary | ICD-10-CM | POA: Diagnosis not present

## 2023-06-07 DIAGNOSIS — G250 Essential tremor: Secondary | ICD-10-CM | POA: Diagnosis not present

## 2023-06-07 DIAGNOSIS — R531 Weakness: Secondary | ICD-10-CM | POA: Diagnosis not present

## 2023-06-07 DIAGNOSIS — R42 Dizziness and giddiness: Secondary | ICD-10-CM | POA: Diagnosis not present

## 2023-06-07 DIAGNOSIS — H539 Unspecified visual disturbance: Secondary | ICD-10-CM | POA: Diagnosis not present

## 2023-06-07 DIAGNOSIS — R825 Elevated urine levels of drugs, medicaments and biological substances: Secondary | ICD-10-CM | POA: Diagnosis not present

## 2023-06-09 DIAGNOSIS — R825 Elevated urine levels of drugs, medicaments and biological substances: Secondary | ICD-10-CM | POA: Diagnosis not present

## 2023-06-09 DIAGNOSIS — R42 Dizziness and giddiness: Secondary | ICD-10-CM | POA: Diagnosis not present

## 2023-06-09 DIAGNOSIS — M542 Cervicalgia: Secondary | ICD-10-CM | POA: Diagnosis not present

## 2023-06-09 DIAGNOSIS — R531 Weakness: Secondary | ICD-10-CM | POA: Diagnosis not present

## 2023-06-09 DIAGNOSIS — F0781 Postconcussional syndrome: Secondary | ICD-10-CM | POA: Diagnosis not present

## 2023-06-09 DIAGNOSIS — H539 Unspecified visual disturbance: Secondary | ICD-10-CM | POA: Diagnosis not present

## 2023-06-09 DIAGNOSIS — G250 Essential tremor: Secondary | ICD-10-CM | POA: Diagnosis not present

## 2023-06-14 DIAGNOSIS — H539 Unspecified visual disturbance: Secondary | ICD-10-CM | POA: Diagnosis not present

## 2023-06-14 DIAGNOSIS — R42 Dizziness and giddiness: Secondary | ICD-10-CM | POA: Diagnosis not present

## 2023-06-14 DIAGNOSIS — F0781 Postconcussional syndrome: Secondary | ICD-10-CM | POA: Diagnosis not present

## 2023-06-14 DIAGNOSIS — R531 Weakness: Secondary | ICD-10-CM | POA: Diagnosis not present

## 2023-06-14 DIAGNOSIS — M542 Cervicalgia: Secondary | ICD-10-CM | POA: Diagnosis not present

## 2023-06-14 DIAGNOSIS — R825 Elevated urine levels of drugs, medicaments and biological substances: Secondary | ICD-10-CM | POA: Diagnosis not present

## 2023-06-14 DIAGNOSIS — G250 Essential tremor: Secondary | ICD-10-CM | POA: Diagnosis not present

## 2023-06-16 DIAGNOSIS — M542 Cervicalgia: Secondary | ICD-10-CM | POA: Diagnosis not present

## 2023-06-16 DIAGNOSIS — G250 Essential tremor: Secondary | ICD-10-CM | POA: Diagnosis not present

## 2023-06-16 DIAGNOSIS — R531 Weakness: Secondary | ICD-10-CM | POA: Diagnosis not present

## 2023-06-16 DIAGNOSIS — R825 Elevated urine levels of drugs, medicaments and biological substances: Secondary | ICD-10-CM | POA: Diagnosis not present

## 2023-06-16 DIAGNOSIS — H539 Unspecified visual disturbance: Secondary | ICD-10-CM | POA: Diagnosis not present

## 2023-06-16 DIAGNOSIS — F0781 Postconcussional syndrome: Secondary | ICD-10-CM | POA: Diagnosis not present

## 2023-06-16 DIAGNOSIS — R42 Dizziness and giddiness: Secondary | ICD-10-CM | POA: Diagnosis not present

## 2023-06-21 DIAGNOSIS — R531 Weakness: Secondary | ICD-10-CM | POA: Diagnosis not present

## 2023-06-21 DIAGNOSIS — F0781 Postconcussional syndrome: Secondary | ICD-10-CM | POA: Diagnosis not present

## 2023-06-21 DIAGNOSIS — R825 Elevated urine levels of drugs, medicaments and biological substances: Secondary | ICD-10-CM | POA: Diagnosis not present

## 2023-06-21 DIAGNOSIS — R42 Dizziness and giddiness: Secondary | ICD-10-CM | POA: Diagnosis not present

## 2023-06-21 DIAGNOSIS — H539 Unspecified visual disturbance: Secondary | ICD-10-CM | POA: Diagnosis not present

## 2023-06-21 DIAGNOSIS — G250 Essential tremor: Secondary | ICD-10-CM | POA: Diagnosis not present

## 2023-06-21 DIAGNOSIS — M542 Cervicalgia: Secondary | ICD-10-CM | POA: Diagnosis not present

## 2023-06-23 DIAGNOSIS — R825 Elevated urine levels of drugs, medicaments and biological substances: Secondary | ICD-10-CM | POA: Diagnosis not present

## 2023-06-23 DIAGNOSIS — G250 Essential tremor: Secondary | ICD-10-CM | POA: Diagnosis not present

## 2023-06-23 DIAGNOSIS — H539 Unspecified visual disturbance: Secondary | ICD-10-CM | POA: Diagnosis not present

## 2023-06-23 DIAGNOSIS — R531 Weakness: Secondary | ICD-10-CM | POA: Diagnosis not present

## 2023-06-23 DIAGNOSIS — F0781 Postconcussional syndrome: Secondary | ICD-10-CM | POA: Diagnosis not present

## 2023-06-23 DIAGNOSIS — M542 Cervicalgia: Secondary | ICD-10-CM | POA: Diagnosis not present

## 2023-06-23 DIAGNOSIS — R42 Dizziness and giddiness: Secondary | ICD-10-CM | POA: Diagnosis not present

## 2023-06-26 DIAGNOSIS — R531 Weakness: Secondary | ICD-10-CM | POA: Diagnosis not present

## 2023-06-26 DIAGNOSIS — G250 Essential tremor: Secondary | ICD-10-CM | POA: Diagnosis not present

## 2023-06-26 DIAGNOSIS — M542 Cervicalgia: Secondary | ICD-10-CM | POA: Diagnosis not present

## 2023-06-26 DIAGNOSIS — R825 Elevated urine levels of drugs, medicaments and biological substances: Secondary | ICD-10-CM | POA: Diagnosis not present

## 2023-06-26 DIAGNOSIS — H539 Unspecified visual disturbance: Secondary | ICD-10-CM | POA: Diagnosis not present

## 2023-06-26 DIAGNOSIS — F0781 Postconcussional syndrome: Secondary | ICD-10-CM | POA: Diagnosis not present

## 2023-06-26 DIAGNOSIS — R42 Dizziness and giddiness: Secondary | ICD-10-CM | POA: Diagnosis not present

## 2023-07-03 DIAGNOSIS — R825 Elevated urine levels of drugs, medicaments and biological substances: Secondary | ICD-10-CM | POA: Diagnosis not present

## 2023-07-03 DIAGNOSIS — M542 Cervicalgia: Secondary | ICD-10-CM | POA: Diagnosis not present

## 2023-07-03 DIAGNOSIS — R42 Dizziness and giddiness: Secondary | ICD-10-CM | POA: Diagnosis not present

## 2023-07-03 DIAGNOSIS — R531 Weakness: Secondary | ICD-10-CM | POA: Diagnosis not present

## 2023-07-03 DIAGNOSIS — H539 Unspecified visual disturbance: Secondary | ICD-10-CM | POA: Diagnosis not present

## 2023-07-03 DIAGNOSIS — G250 Essential tremor: Secondary | ICD-10-CM | POA: Diagnosis not present

## 2023-07-03 DIAGNOSIS — F0781 Postconcussional syndrome: Secondary | ICD-10-CM | POA: Diagnosis not present

## 2023-08-11 DIAGNOSIS — F3181 Bipolar II disorder: Secondary | ICD-10-CM | POA: Diagnosis not present

## 2023-08-11 DIAGNOSIS — F5101 Primary insomnia: Secondary | ICD-10-CM | POA: Diagnosis not present

## 2023-08-11 DIAGNOSIS — G250 Essential tremor: Secondary | ICD-10-CM | POA: Diagnosis not present

## 2023-08-11 DIAGNOSIS — F411 Generalized anxiety disorder: Secondary | ICD-10-CM | POA: Diagnosis not present

## 2023-08-15 DIAGNOSIS — J0100 Acute maxillary sinusitis, unspecified: Secondary | ICD-10-CM | POA: Diagnosis not present

## 2023-09-15 DIAGNOSIS — R051 Acute cough: Secondary | ICD-10-CM | POA: Diagnosis not present

## 2023-09-15 DIAGNOSIS — J209 Acute bronchitis, unspecified: Secondary | ICD-10-CM | POA: Diagnosis not present

## 2023-09-17 DIAGNOSIS — R519 Headache, unspecified: Secondary | ICD-10-CM | POA: Diagnosis not present

## 2023-09-17 DIAGNOSIS — R051 Acute cough: Secondary | ICD-10-CM | POA: Diagnosis not present

## 2023-09-17 DIAGNOSIS — J101 Influenza due to other identified influenza virus with other respiratory manifestations: Secondary | ICD-10-CM | POA: Diagnosis not present

## 2023-09-17 DIAGNOSIS — R0981 Nasal congestion: Secondary | ICD-10-CM | POA: Diagnosis not present

## 2023-10-26 DIAGNOSIS — R319 Hematuria, unspecified: Secondary | ICD-10-CM | POA: Diagnosis not present

## 2023-10-26 DIAGNOSIS — Z30432 Encounter for removal of intrauterine contraceptive device: Secondary | ICD-10-CM | POA: Diagnosis not present

## 2023-10-26 DIAGNOSIS — Z01419 Encounter for gynecological examination (general) (routine) without abnormal findings: Secondary | ICD-10-CM | POA: Diagnosis not present

## 2023-10-26 DIAGNOSIS — Z6826 Body mass index (BMI) 26.0-26.9, adult: Secondary | ICD-10-CM | POA: Diagnosis not present

## 2023-12-01 DIAGNOSIS — F1010 Alcohol abuse, uncomplicated: Secondary | ICD-10-CM | POA: Diagnosis not present

## 2023-12-01 DIAGNOSIS — F1090 Alcohol use, unspecified, uncomplicated: Secondary | ICD-10-CM | POA: Diagnosis not present

## 2023-12-01 DIAGNOSIS — R519 Headache, unspecified: Secondary | ICD-10-CM | POA: Diagnosis not present

## 2023-12-01 DIAGNOSIS — R4182 Altered mental status, unspecified: Secondary | ICD-10-CM | POA: Diagnosis not present

## 2023-12-06 DIAGNOSIS — F0781 Postconcussional syndrome: Secondary | ICD-10-CM | POA: Diagnosis not present

## 2023-12-06 DIAGNOSIS — R2689 Other abnormalities of gait and mobility: Secondary | ICD-10-CM | POA: Diagnosis not present

## 2023-12-06 DIAGNOSIS — R413 Other amnesia: Secondary | ICD-10-CM | POA: Diagnosis not present

## 2023-12-06 DIAGNOSIS — R4789 Other speech disturbances: Secondary | ICD-10-CM | POA: Diagnosis not present

## 2023-12-06 DIAGNOSIS — H8110 Benign paroxysmal vertigo, unspecified ear: Secondary | ICD-10-CM | POA: Diagnosis not present

## 2024-01-04 DIAGNOSIS — F5101 Primary insomnia: Secondary | ICD-10-CM | POA: Diagnosis not present

## 2024-01-04 DIAGNOSIS — F3181 Bipolar II disorder: Secondary | ICD-10-CM | POA: Diagnosis not present

## 2024-01-04 DIAGNOSIS — F411 Generalized anxiety disorder: Secondary | ICD-10-CM | POA: Diagnosis not present

## 2024-01-04 DIAGNOSIS — G250 Essential tremor: Secondary | ICD-10-CM | POA: Diagnosis not present

## 2024-01-16 DIAGNOSIS — Z1159 Encounter for screening for other viral diseases: Secondary | ICD-10-CM | POA: Diagnosis not present

## 2024-01-16 DIAGNOSIS — Z0189 Encounter for other specified special examinations: Secondary | ICD-10-CM | POA: Diagnosis not present

## 2024-01-16 DIAGNOSIS — K219 Gastro-esophageal reflux disease without esophagitis: Secondary | ICD-10-CM | POA: Diagnosis not present

## 2024-01-16 DIAGNOSIS — F319 Bipolar disorder, unspecified: Secondary | ICD-10-CM | POA: Diagnosis not present

## 2024-01-16 DIAGNOSIS — M2550 Pain in unspecified joint: Secondary | ICD-10-CM | POA: Diagnosis not present

## 2024-01-16 DIAGNOSIS — R5383 Other fatigue: Secondary | ICD-10-CM | POA: Diagnosis not present

## 2024-01-16 DIAGNOSIS — E7800 Pure hypercholesterolemia, unspecified: Secondary | ICD-10-CM | POA: Diagnosis not present

## 2024-01-17 ENCOUNTER — Other Ambulatory Visit: Payer: Self-pay | Admitting: Family Medicine

## 2024-01-17 DIAGNOSIS — Z0000 Encounter for general adult medical examination without abnormal findings: Secondary | ICD-10-CM

## 2024-02-05 DIAGNOSIS — C44619 Basal cell carcinoma of skin of left upper limb, including shoulder: Secondary | ICD-10-CM | POA: Diagnosis not present

## 2024-02-05 DIAGNOSIS — D485 Neoplasm of uncertain behavior of skin: Secondary | ICD-10-CM | POA: Diagnosis not present

## 2024-02-05 DIAGNOSIS — L720 Epidermal cyst: Secondary | ICD-10-CM | POA: Diagnosis not present

## 2024-03-14 DIAGNOSIS — C44619 Basal cell carcinoma of skin of left upper limb, including shoulder: Secondary | ICD-10-CM | POA: Diagnosis not present

## 2024-03-21 DIAGNOSIS — L905 Scar conditions and fibrosis of skin: Secondary | ICD-10-CM | POA: Diagnosis not present

## 2024-05-19 DIAGNOSIS — M62830 Muscle spasm of back: Secondary | ICD-10-CM | POA: Diagnosis not present

## 2024-05-20 DIAGNOSIS — M9903 Segmental and somatic dysfunction of lumbar region: Secondary | ICD-10-CM | POA: Diagnosis not present

## 2024-05-20 DIAGNOSIS — M62830 Muscle spasm of back: Secondary | ICD-10-CM | POA: Diagnosis not present

## 2024-05-20 DIAGNOSIS — M9902 Segmental and somatic dysfunction of thoracic region: Secondary | ICD-10-CM | POA: Diagnosis not present

## 2024-05-20 DIAGNOSIS — M9905 Segmental and somatic dysfunction of pelvic region: Secondary | ICD-10-CM | POA: Diagnosis not present

## 2024-05-22 DIAGNOSIS — M62830 Muscle spasm of back: Secondary | ICD-10-CM | POA: Diagnosis not present

## 2024-05-22 DIAGNOSIS — M9902 Segmental and somatic dysfunction of thoracic region: Secondary | ICD-10-CM | POA: Diagnosis not present

## 2024-05-22 DIAGNOSIS — M9905 Segmental and somatic dysfunction of pelvic region: Secondary | ICD-10-CM | POA: Diagnosis not present

## 2024-05-24 DIAGNOSIS — M9902 Segmental and somatic dysfunction of thoracic region: Secondary | ICD-10-CM | POA: Diagnosis not present

## 2024-05-24 DIAGNOSIS — M9903 Segmental and somatic dysfunction of lumbar region: Secondary | ICD-10-CM | POA: Diagnosis not present

## 2024-05-24 DIAGNOSIS — M9905 Segmental and somatic dysfunction of pelvic region: Secondary | ICD-10-CM | POA: Diagnosis not present

## 2024-05-24 DIAGNOSIS — M62830 Muscle spasm of back: Secondary | ICD-10-CM | POA: Diagnosis not present

## 2024-05-29 DIAGNOSIS — M9905 Segmental and somatic dysfunction of pelvic region: Secondary | ICD-10-CM | POA: Diagnosis not present

## 2024-05-29 DIAGNOSIS — M9902 Segmental and somatic dysfunction of thoracic region: Secondary | ICD-10-CM | POA: Diagnosis not present

## 2024-05-29 DIAGNOSIS — M62830 Muscle spasm of back: Secondary | ICD-10-CM | POA: Diagnosis not present

## 2024-05-29 DIAGNOSIS — M9903 Segmental and somatic dysfunction of lumbar region: Secondary | ICD-10-CM | POA: Diagnosis not present

## 2024-06-06 DIAGNOSIS — F3181 Bipolar II disorder: Secondary | ICD-10-CM | POA: Diagnosis not present

## 2024-06-06 DIAGNOSIS — F411 Generalized anxiety disorder: Secondary | ICD-10-CM | POA: Diagnosis not present

## 2024-06-06 DIAGNOSIS — G250 Essential tremor: Secondary | ICD-10-CM | POA: Diagnosis not present

## 2024-06-06 DIAGNOSIS — F5101 Primary insomnia: Secondary | ICD-10-CM | POA: Diagnosis not present

## 2024-08-06 DIAGNOSIS — J019 Acute sinusitis, unspecified: Secondary | ICD-10-CM | POA: Diagnosis not present
